# Patient Record
Sex: Female | Born: 1955
Health system: Southern US, Community
[De-identification: ages and names within clinical notes are randomized; demographics above are authoritative.]

## PROBLEM LIST (undated history)

## (undated) DIAGNOSIS — M1990 Unspecified osteoarthritis, unspecified site: Secondary | ICD-10-CM

## (undated) DIAGNOSIS — Z9889 Other specified postprocedural states: Secondary | ICD-10-CM

## (undated) DIAGNOSIS — E119 Type 2 diabetes mellitus without complications: Secondary | ICD-10-CM

## (undated) DIAGNOSIS — L732 Hidradenitis suppurativa: Secondary | ICD-10-CM

## (undated) DIAGNOSIS — K219 Gastro-esophageal reflux disease without esophagitis: Secondary | ICD-10-CM

## (undated) DIAGNOSIS — F32A Depression, unspecified: Secondary | ICD-10-CM

## (undated) DIAGNOSIS — J45909 Unspecified asthma, uncomplicated: Secondary | ICD-10-CM

## (undated) DIAGNOSIS — R112 Nausea with vomiting, unspecified: Secondary | ICD-10-CM

## (undated) DIAGNOSIS — K529 Noninfective gastroenteritis and colitis, unspecified: Secondary | ICD-10-CM

## (undated) HISTORY — PX: CHOLECYSTECTOMY: SHX55

## (undated) HISTORY — PX: SPINAL FUSION: SHX223

## (undated) HISTORY — PX: FOOT SURGERY: SHX648

## (undated) HISTORY — PX: TUBAL LIGATION: SHX77

## (undated) HISTORY — PX: TONSILLECTOMY: SUR1361

## (undated) HISTORY — PX: HYDRADENITIS EXCISION: SHX5243

## (undated) HISTORY — PX: APPENDECTOMY: SHX54

## (undated) HISTORY — PX: BACK SURGERY: SHX140

## (undated) HISTORY — PX: ABDOMINAL HYSTERECTOMY: SHX81

## (undated) HISTORY — PX: HAND SURGERY: SHX662

---

## 2005-01-25 ENCOUNTER — Emergency Department: Payer: Self-pay | Admitting: Emergency Medicine

## 2005-11-23 ENCOUNTER — Ambulatory Visit: Payer: Self-pay

## 2006-05-23 ENCOUNTER — Ambulatory Visit: Payer: Self-pay | Admitting: Family Medicine

## 2006-12-06 ENCOUNTER — Emergency Department: Payer: Self-pay | Admitting: Emergency Medicine

## 2006-12-13 ENCOUNTER — Ambulatory Visit: Payer: Self-pay

## 2008-03-16 ENCOUNTER — Ambulatory Visit: Payer: Self-pay | Admitting: Family Medicine

## 2011-09-19 DIAGNOSIS — L732 Hidradenitis suppurativa: Secondary | ICD-10-CM | POA: Insufficient documentation

## 2014-01-07 DIAGNOSIS — M65341 Trigger finger, right ring finger: Secondary | ICD-10-CM | POA: Insufficient documentation

## 2014-04-27 ENCOUNTER — Ambulatory Visit: Payer: Self-pay | Admitting: Internal Medicine

## 2014-06-03 ENCOUNTER — Ambulatory Visit: Payer: Self-pay | Admitting: Urology

## 2014-06-18 ENCOUNTER — Ambulatory Visit: Payer: Self-pay | Admitting: Unknown Physician Specialty

## 2014-07-06 ENCOUNTER — Ambulatory Visit: Payer: Self-pay | Admitting: Podiatry

## 2014-11-30 DIAGNOSIS — Z87891 Personal history of nicotine dependence: Secondary | ICD-10-CM | POA: Insufficient documentation

## 2014-11-30 DIAGNOSIS — K8020 Calculus of gallbladder without cholecystitis without obstruction: Secondary | ICD-10-CM | POA: Insufficient documentation

## 2015-08-24 DIAGNOSIS — M4712 Other spondylosis with myelopathy, cervical region: Secondary | ICD-10-CM | POA: Insufficient documentation

## 2019-07-28 ENCOUNTER — Emergency Department: Payer: Medicaid Other

## 2019-07-28 ENCOUNTER — Encounter: Payer: Self-pay | Admitting: Emergency Medicine

## 2019-07-28 ENCOUNTER — Emergency Department
Admission: EM | Admit: 2019-07-28 | Discharge: 2019-07-28 | Disposition: A | Discharge status: Home / Self Care | Payer: Medicaid Other | Attending: Emergency Medicine | Admitting: Emergency Medicine

## 2019-07-28 ENCOUNTER — Other Ambulatory Visit: Payer: Self-pay

## 2019-07-28 DIAGNOSIS — Z91040 Latex allergy status: Secondary | ICD-10-CM | POA: Diagnosis not present

## 2019-07-28 DIAGNOSIS — Z87891 Personal history of nicotine dependence: Secondary | ICD-10-CM | POA: Insufficient documentation

## 2019-07-28 DIAGNOSIS — R42 Dizziness and giddiness: Secondary | ICD-10-CM | POA: Diagnosis not present

## 2019-07-28 DIAGNOSIS — R112 Nausea with vomiting, unspecified: Secondary | ICD-10-CM | POA: Insufficient documentation

## 2019-07-28 DIAGNOSIS — R739 Hyperglycemia, unspecified: Secondary | ICD-10-CM

## 2019-07-28 DIAGNOSIS — E1165 Type 2 diabetes mellitus with hyperglycemia: Secondary | ICD-10-CM | POA: Insufficient documentation

## 2019-07-28 HISTORY — DX: Type 2 diabetes mellitus without complications: E11.9

## 2019-07-28 LAB — BASIC METABOLIC PANEL
Anion gap: 14 (ref 5–15)
BUN: 15 mg/dL (ref 8–23)
CO2: 24 mmol/L (ref 22–32)
Calcium: 10 mg/dL (ref 8.9–10.3)
Chloride: 95 mmol/L — ABNORMAL LOW (ref 98–111)
Creatinine, Ser: 0.56 mg/dL (ref 0.44–1.00)
GFR calc Af Amer: >60 mL/min (ref >60)
GFR calc non Af Amer: >60 mL/min (ref >60)
Glucose, Bld: 419 mg/dL — ABNORMAL HIGH (ref 70–99)
Potassium: 4.4 mmol/L (ref 3.5–5.1)
Sodium: 133 mmol/L — ABNORMAL LOW (ref 135–145)

## 2019-07-28 LAB — URINALYSIS, COMPLETE (UACMP) WITH MICROSCOPIC
Bacteria, UA: NONE SEEN
Bilirubin Urine: NEGATIVE
Glucose, UA: >=500 mg/dL — AB
Hgb urine dipstick: NEGATIVE
Ketones, ur: 5 mg/dL — AB
Leukocytes,Ua: NEGATIVE
Nitrite: NEGATIVE
Protein, ur: NEGATIVE mg/dL
Specific Gravity, Urine: 1.039 — ABNORMAL HIGH (ref 1.005–1.030)
pH: 5 (ref 5.0–8.0)

## 2019-07-28 LAB — GLUCOSE, CAPILLARY
Glucose-Capillary: 380 mg/dL — ABNORMAL HIGH (ref 70–99)
Glucose-Capillary: 266 mg/dL — ABNORMAL HIGH (ref 70–99)
Glucose-Capillary: 211 mg/dL — ABNORMAL HIGH (ref 70–99)

## 2019-07-28 LAB — CBC
HCT: 45.8 % (ref 36.0–46.0)
Hemoglobin: 15.9 g/dL — ABNORMAL HIGH (ref 12.0–15.0)
MCH: 28.7 pg (ref 26.0–34.0)
MCHC: 34.7 g/dL (ref 30.0–36.0)
MCV: 82.7 fL (ref 80.0–100.0)
Platelets: 294 10*3/uL (ref 150–400)
RBC: 5.54 MIL/uL — ABNORMAL HIGH (ref 3.87–5.11)
RDW: 11.8 % (ref 11.5–15.5)
WBC: 7.2 10*3/uL (ref 4.0–10.5)
nRBC: 0 % (ref 0.0–0.2)

## 2019-07-28 LAB — TROPONIN I (HIGH SENSITIVITY): Troponin I (High Sensitivity): 4 ng/L (ref <18)

## 2019-07-28 MED ORDER — GLIPIZIDE 5 MG PO TABS: 5.0000 mg | ORAL_TABLET | Freq: Every day | ORAL | 0 refills | Status: DC

## 2019-07-28 MED ORDER — MECLIZINE HCL 25 MG PO TABS
25.0000 mg | ORAL_TABLET | Freq: Once | ORAL | Status: AC
  Administered 2019-07-28: 25 mg via ORAL
  Filled 2019-07-28: qty 1

## 2019-07-28 MED ORDER — SODIUM CHLORIDE 0.9 % IV BOLUS
1000.0000 mL | Freq: Once | INTRAVENOUS | Status: AC
  Administered 2019-07-28: 1000 mL via INTRAVENOUS

## 2019-07-28 MED ORDER — ONDANSETRON HCL 4 MG/2ML IJ SOLN
4.0000 mg | Freq: Once | INTRAMUSCULAR | Status: AC
  Administered 2019-07-28: 4 mg via INTRAVENOUS
  Filled 2019-07-28: qty 2

## 2019-07-28 MED ORDER — MECLIZINE HCL 25 MG PO TABS: 25.0000 mg | ORAL_TABLET | Freq: Three times a day (TID) | ORAL | 0 refills | Status: AC | PRN

## 2019-07-28 NOTE — ED Provider Notes (Addendum)
[facility] Emergency Department Provider Note  ____________________________________________   First MD Initiated Contact with Patient 07/28/19 1652     (approximate)  I have reviewed the triage vital signs and the nursing notes.   HISTORY  Chief Complaint Dizziness    HPI Debra Arnold is a 63 y.o. female with diabetes who presents with dizziness.  Patient has been having dizziness for the past days.  Is been associated intermittently with vomiting.  The dizziness is severe, constant, better with her eyes closed, worse with her eyes open and moving around.  She intermittently has a headache but none currently.  She says that she has been ataxic with walking with this.  Recently tested for Lyme disease but never got her results back.   Past Medical History:  Diagnosis Date  . Diabetes mellitus without complication ([place])     There are no active problems to display for this patient.   Past Surgical History:  Procedure Laterality Date  . ABDOMINAL HYSTERECTOMY    . APPENDECTOMY    . CHOLECYSTECTOMY    . FOOT SURGERY Left   . HAND SURGERY Right   . SPINAL FUSION    . TONSILLECTOMY    . TUBAL LIGATION      Prior to Admission medications   Not on File    Allergies Demerol [meperidine hcl] and Latex  History reviewed. No pertinent family history.  Social History Social History   Tobacco Use  . Smoking status: Former Research scientist (life sciences)  . Smokeless tobacco: Never Used  Substance Use Topics  . Alcohol use: Never    Frequency: Never  . Drug use: Never      Review of Systems Constitutional: No fever/chills Eyes: No visual changes. ENT: No sore throat. Cardiovascular: Denies chest pain. Respiratory: Denies shortness of breath. Gastrointestinal: No abdominal pain.  Positive vomiting no diarrhea.  No constipation. Genitourinary: Negative for dysuria. Musculoskeletal: Negative for back pain. Skin: Negative for rash. Neurological: Dizziness,  difficulty ambulating All other ROS negative ____________________________________________   PHYSICAL EXAM:  VITAL SIGNS: ED Triage Vitals  Enc Vitals Group     BP 07/28/19 1543 130/78     Pulse Rate 07/28/19 1543 (!) 106     Resp 07/28/19 1543 14     Temp 07/28/19 1543 98.6 F (37 C)     Temp Source 07/28/19 1543 Oral     SpO2 07/28/19 1543 98 %     Weight 07/28/19 1546 152 lb (68.9 kg)     Height 07/28/19 1546 5\' 6"  (1.676 m)     Head Circumference --      Peak Flow --      Pain Score 07/28/19 1546 0     Pain Loc --      Pain Edu? --      Excl. in [place]? --     Constitutional: Alert and oriented. Well appearing and in no acute distress. Eyes: Conjunctivae are normal. EOMI. Head: Atraumatic. Nose: No congestion/rhinnorhea. Mouth/Throat: Mucous membranes are moist.   Neck: No stridor. Trachea Midline. FROM Cardiovascular: Normal rate, regular rhythm. Grossly normal heart sounds.  Good peripheral circulation. Respiratory: Normal respiratory effort.  No retractions. Lungs CTAB. Gastrointestinal: Soft and nontender. No distention. No abdominal bruits.  Musculoskeletal: No lower extremity tenderness nor edema.  No joint effusions. Neurologic: Cranial nerves II through XII are intact.  Equal strength in arms and legs.  Finger-to-nose not as great on the left hand.  Slightly ataxic with walking.  Skin:  Skin  is warm, dry and intact. No rash noted. Psychiatric: Mood and affect are normal. Speech and behavior are normal. GU: Deferred   ____________________________________________   LABS (all labs ordered are listed, but only abnormal results are displayed)  Labs Reviewed  BASIC METABOLIC PANEL - Abnormal; Notable for the following components:      Result Value   Sodium 133 (*)    Chloride 95 (*)    Glucose, Bld 419 (*)    All other components within normal limits  CBC - Abnormal; Notable for the following components:   RBC 5.54 (*)    Hemoglobin 15.9 (*)    All other  components within normal limits  URINALYSIS, COMPLETE (UACMP) WITH MICROSCOPIC - Abnormal; Notable for the following components:   Color, Urine STRAW (*)    APPearance CLEAR (*)    Specific Gravity, Urine 1.039 (*)    Glucose, UA >=500 (*)    Ketones, ur 5 (*)    All other components within normal limits  GLUCOSE, CAPILLARY - Abnormal; Notable for the following components:   Glucose-Capillary 380 (*)    All other components within normal limits  CBG MONITORING, ED   ____________________________________________   ED ECG REPORT I, Concha SeMary E Anahis Furgeson, the attending physician, personally viewed and interpreted this ECG.  EKG is sinus tachycardia rate of 107, no ST elevation, no T wave inversion, normal intervals, left axis deviation __  INITIAL IMPRESSION / ASSESSMENT AND PLAN / ED COURSE  Melanie CrazierKaren L Boyte was evaluated in Emergency Department on 07/28/2019 for the symptoms described in the history of present illness. She was evaluated in the context of the global COVID-19 pandemic, which necessitated consideration that the patient might be at risk for infection with the SARS-CoV-2 virus that causes COVID-19. Institutional protocols and algorithms that pertain to the evaluation of patients at risk for COVID-19 are in a state of rapid change based on information released by regulatory bodies including the CDC and federal and state organizations. These policies and algorithms were followed during the patient's care in the ED.    Patient is a 63 year old who presents with 5 days of dizziness, ataxia and vomiting.  Concern that this could be a posterior stroke therefore will get MRI to rule this out.  Also will get labs to evaluate for electrolyte abnormalities, DKA, hyperglycemia, anemia.  Urine with 6-10 WBCs and greater than 500 glucose no symptoms so we will send for culture. Denies any symptoms of UTI.   Sugars elevated 380 but no anion gap elevation to suggest DKA.  No evidence of anemia.   Patient will be given 2 L of fluids given the high sugars and tachycardia.  Dehydration uncontrolled sugars could be contributing to her symptoms.  However will still get MRI to make sure there is no signs of stroke.  Patient is feeling better after the fluids.  Patient says that she ran out of glipizide therefore we will give a 1 month prescription for this.  Patient is encouraged to follow-up with her primary care doctor for repeat sugar testing in 1 week.  MRI negative.  Pt d/c home.    ____________________________________________   FINAL CLINICAL IMPRESSION(S) / ED DIAGNOSES   Final diagnoses:  Dizziness  Hyperglycemia      MEDICATIONS GIVEN DURING THIS VISIT:  Medications  sodium chloride 0.9 % bolus 1,000 mL (has no administration in time range)  sodium chloride 0.9 % bolus 1,000 mL (1,000 mLs Intravenous New Bag/Given 07/28/19 1743)  meclizine (ANTIVERT) tablet 25  mg (25 mg Oral Given 07/28/19 1744)  ondansetron (ZOFRAN) injection 4 mg (4 mg Intravenous Given 07/28/19 1744)     ED Discharge Orders         Ordered    glipiZIDE (GLUCOTROL) 5 MG tablet  Daily     07/28/19 2011    meclizine (ANTIVERT) 25 MG tablet  3 times daily PRN     07/28/19 2011           Note:  This document was prepared using Dragon voice recognition software and may include unintentional dictation errors.   Concha Se, MD 07/28/19 2026    Concha Se, MD 07/28/19 2124

## 2019-07-28 NOTE — ED Notes (Signed)
Report off to daniel rn  

## 2019-07-28 NOTE — Discharge Instructions (Addendum)
Your work-up was reassuring except for elevated sugar.  We restarted your glipizide to take with your Metformin.  You can take the meclizine to help with the dizziness.  You should follow-up with your doctor within 1 week for blood glucose recheck.  Return to the ER for any other concerns.

## 2019-07-28 NOTE — ED Notes (Signed)
Patient to MRI.

## 2019-07-28 NOTE — ED Triage Notes (Signed)
Pt to ED from home c/o dizziness since Thursday, nausea with vomiting first couple days, none today.  States when she opens her eyes it's hard to focus.  States intermittent headache, no pain at this time.  Tested for lyme disease recently for multiple tick bites.

## 2019-07-30 LAB — URINE CULTURE: Culture: <10000 — AB

## 2019-12-10 ENCOUNTER — Encounter: Payer: Self-pay | Admitting: Emergency Medicine

## 2019-12-10 ENCOUNTER — Emergency Department
Admission: EM | Admit: 2019-12-10 | Discharge: 2019-12-10 | Disposition: A | Discharge status: Home / Self Care | Payer: Medicaid Other | Attending: Emergency Medicine | Admitting: Emergency Medicine

## 2019-12-10 ENCOUNTER — Other Ambulatory Visit: Payer: Self-pay

## 2019-12-10 ENCOUNTER — Emergency Department: Payer: Medicaid Other

## 2019-12-10 DIAGNOSIS — G8929 Other chronic pain: Secondary | ICD-10-CM | POA: Diagnosis not present

## 2019-12-10 DIAGNOSIS — Z87891 Personal history of nicotine dependence: Secondary | ICD-10-CM | POA: Diagnosis not present

## 2019-12-10 DIAGNOSIS — Z91040 Latex allergy status: Secondary | ICD-10-CM | POA: Insufficient documentation

## 2019-12-10 DIAGNOSIS — R1011 Right upper quadrant pain: Secondary | ICD-10-CM | POA: Diagnosis not present

## 2019-12-10 DIAGNOSIS — E119 Type 2 diabetes mellitus without complications: Secondary | ICD-10-CM | POA: Diagnosis not present

## 2019-12-10 DIAGNOSIS — Z9049 Acquired absence of other specified parts of digestive tract: Secondary | ICD-10-CM | POA: Diagnosis not present

## 2019-12-10 LAB — URINALYSIS, COMPLETE (UACMP) WITH MICROSCOPIC
Bacteria, UA: NONE SEEN
Bilirubin Urine: NEGATIVE
Glucose, UA: >=500 mg/dL — AB
Hgb urine dipstick: NEGATIVE
Ketones, ur: NEGATIVE mg/dL
Leukocytes,Ua: NEGATIVE
Nitrite: NEGATIVE
Protein, ur: NEGATIVE mg/dL
Specific Gravity, Urine: 1.032 — ABNORMAL HIGH (ref 1.005–1.030)
pH: 5 (ref 5.0–8.0)

## 2019-12-10 LAB — COMPREHENSIVE METABOLIC PANEL
ALT: 19 U/L (ref 0–44)
AST: 23 U/L (ref 15–41)
Albumin: 4.3 g/dL (ref 3.5–5.0)
Alkaline Phosphatase: 81 U/L (ref 38–126)
Anion gap: 15 (ref 5–15)
BUN: 16 mg/dL (ref 8–23)
CO2: 22 mmol/L (ref 22–32)
Calcium: 9.4 mg/dL (ref 8.9–10.3)
Chloride: 94 mmol/L — ABNORMAL LOW (ref 98–111)
Creatinine, Ser: 0.64 mg/dL (ref 0.44–1.00)
GFR calc Af Amer: >60 mL/min (ref >60)
GFR calc non Af Amer: >60 mL/min (ref >60)
Glucose, Bld: 578 mg/dL (ref 70–99)
Potassium: 3.8 mmol/L (ref 3.5–5.1)
Sodium: 131 mmol/L — ABNORMAL LOW (ref 135–145)
Total Bilirubin: 0.7 mg/dL (ref 0.3–1.2)
Total Protein: 7.3 g/dL (ref 6.5–8.1)

## 2019-12-10 LAB — CBC
HCT: 40.2 % (ref 36.0–46.0)
Hemoglobin: 14 g/dL (ref 12.0–15.0)
MCH: 29.5 pg (ref 26.0–34.0)
MCHC: 34.8 g/dL (ref 30.0–36.0)
MCV: 84.6 fL (ref 80.0–100.0)
Platelets: 233 10*3/uL (ref 150–400)
RBC: 4.75 MIL/uL (ref 3.87–5.11)
RDW: 11.5 % (ref 11.5–15.5)
WBC: 5.8 10*3/uL (ref 4.0–10.5)
nRBC: 0.3 % — ABNORMAL HIGH (ref 0.0–0.2)

## 2019-12-10 LAB — LIPASE, BLOOD: Lipase: 37 U/L (ref 11–51)

## 2019-12-10 LAB — GLUCOSE, CAPILLARY: Glucose-Capillary: 171 mg/dL — ABNORMAL HIGH (ref 70–99)

## 2019-12-10 MED ORDER — INSULIN ASPART 100 UNIT/ML ~~LOC~~ SOLN
10.0000 [IU] | Freq: Once | SUBCUTANEOUS | Status: AC
  Administered 2019-12-10: 10 [IU] via INTRAVENOUS
  Filled 2019-12-10: qty 1

## 2019-12-10 MED ORDER — SODIUM CHLORIDE 0.9 % IV BOLUS
1000.0000 mL | Freq: Once | INTRAVENOUS | Status: AC
  Administered 2019-12-10: 1000 mL via INTRAVENOUS

## 2019-12-10 MED ORDER — KETOROLAC TROMETHAMINE 30 MG/ML IJ SOLN
15.0000 mg | INTRAMUSCULAR | Status: AC
  Administered 2019-12-10: 15 mg via INTRAVENOUS
  Filled 2019-12-10: qty 1

## 2019-12-10 MED ORDER — GLIPIZIDE 5 MG PO TABS: 5.0000 mg | ORAL_TABLET | Freq: Every day | ORAL | 1 refills | Status: AC

## 2019-12-10 MED ORDER — METOCLOPRAMIDE HCL 10 MG PO TABS: 10.0000 mg | ORAL_TABLET | Freq: Four times a day (QID) | ORAL | 0 refills | Status: AC | PRN

## 2019-12-10 MED ORDER — ALUMINUM-MAGNESIUM-SIMETHICONE 200-200-20 MG/5ML PO SUSP: 30.0000 mL | Freq: Three times a day (TID) | ORAL | 0 refills | Status: AC

## 2019-12-10 MED ORDER — GLIPIZIDE 5 MG PO TABS: 5.0000 mg | ORAL_TABLET | Freq: Every day | ORAL | 1 refills | Status: DC

## 2019-12-10 NOTE — ED Provider Notes (Signed)
[facility] Emergency Department Provider Note  ____________________________________________  Time seen: Approximately 7:31 PM  I have reviewed the triage vital signs and the nursing notes.   HISTORY  Chief Complaint Abdominal Pain    HPI Debra Arnold is a 64 y.o. female with a history of diabetes who comes ED complaining of right upper quadrant abdominal pain for the past 6 weeks.  Reports that she went to an emergency department while traveling in [place] state 2 weeks ago, had a contrast-enhanced CT scan which they said showed diverticulosis without diverticulitis, no other acute findings.  She has a history of cholecystectomy about 5 years ago.  Oral intake has been normal.  No nausea vomiting or diarrhea.  Pain is nonradiating without aggravating or alleviating factors.  She is taking Carafate and Protonix without relief.      Past Medical History:  Diagnosis Date  . Diabetes mellitus without complication ([place])      There are no problems to display for this patient.    Past Surgical History:  Procedure Laterality Date  . ABDOMINAL HYSTERECTOMY    . APPENDECTOMY    . CHOLECYSTECTOMY    . FOOT SURGERY Left   . HAND SURGERY Right   . SPINAL FUSION    . TONSILLECTOMY    . TUBAL LIGATION       Prior to Admission medications   Medication Sig Start Date End Date Taking? Authorizing Provider  glipiZIDE (GLUCOTROL) 5 MG tablet Take 1 tablet (5 mg total) by mouth daily. 07/28/19 08/27/19  Vanessa Bronson, MD  meclizine (ANTIVERT) 25 MG tablet Take 1 tablet (25 mg total) by mouth 3 (three) times daily as needed for dizziness. 07/28/19   Vanessa [place], MD     Allergies Demerol [meperidine hcl], Latex, and Metformin and related   No family history on file.  Social History Social History   Tobacco Use  . Smoking status: Former Research scientist (life sciences)  . Smokeless tobacco: Never Used  Substance Use Topics  . Alcohol use: Never  . Drug use: Never     Review of Systems  Constitutional:   No fever or chills.  ENT:   No sore throat. No rhinorrhea. Cardiovascular:   No chest pain or syncope. Respiratory:   No dyspnea or cough. Gastrointestinal:   Positive as above for abdominal pain without vomiting and diarrhea.  Musculoskeletal:   Negative for focal pain or swelling All other systems reviewed and are negative except as documented above in ROS and HPI.  ____________________________________________   PHYSICAL EXAM:  VITAL SIGNS: ED Triage Vitals  Enc Vitals Group     BP 12/10/19 1607 (!) 147/79     Pulse Rate 12/10/19 1607 (!) 103     Resp 12/10/19 1607 16     Temp 12/10/19 1607 98.2 F (36.8 C)     Temp Source 12/10/19 1607 Oral     SpO2 12/10/19 1607 98 %     Weight 12/10/19 1608 168 lb (76.2 kg)     Height 12/10/19 1608 5\' 6"  (1.676 m)     Head Circumference --      Peak Flow --      Pain Score 12/10/19 1607 8     Pain Loc --      Pain Edu? --      Excl. in [place]? --     Vital signs reviewed, nursing assessments reviewed.   Constitutional:   Alert and oriented. Non-toxic appearance. Eyes:   Conjunctivae are  normal. EOMI. PERRL. ENT      Head:   Normocephalic and atraumatic.      Nose:   Wearing a mask.      Mouth/Throat:   Wearing a mask.      Neck:   No meningismus. Full ROM. Hematological/Lymphatic/Immunilogical:   No cervical lymphadenopathy. Cardiovascular:   RRR. Symmetric bilateral radial and DP pulses.  No murmurs. Cap refill less than 2 seconds. Respiratory:   Normal respiratory effort without tachypnea/retractions. Breath sounds are clear and equal bilaterally. No wheezes/rales/rhonchi. Gastrointestinal:   Soft with right upper quadrant tenderness. Non distended. There is no CVA tenderness.  No rebound, rigidity, or guarding. Musculoskeletal:   Normal range of motion in all extremities. No joint effusions.  No lower extremity tenderness.  No edema. Neurologic:   Normal speech and language.  Motor  grossly intact. No acute focal neurologic deficits are appreciated.  Skin:    Skin is warm, dry and intact. No rash noted.  No petechiae, purpura, or bullae.  ____________________________________________    LABS (pertinent positives/negatives) (all labs ordered are listed, but only abnormal results are displayed) Labs Reviewed  COMPREHENSIVE METABOLIC PANEL - Abnormal; Notable for the following components:      Result Value   Sodium 131 (*)    Chloride 94 (*)    Glucose, Bld 578 (*)    All other components within normal limits  CBC - Abnormal; Notable for the following components:   nRBC 0.3 (*)    All other components within normal limits  URINALYSIS, COMPLETE (UACMP) WITH MICROSCOPIC - Abnormal; Notable for the following components:   Color, Urine STRAW (*)    APPearance CLEAR (*)    Specific Gravity, Urine 1.032 (*)    Glucose, UA >=500 (*)    All other components within normal limits  LIPASE, BLOOD   ____________________________________________   EKG    ____________________________________________    RADIOLOGY  US Abdomen Complete  Result Date: 12/10/2019 CLINICAL DATA:  Right upper quadrant pain EXAM: ABDOMEN ULTRASOUND COMPLETE COMPARISON:  CT 04/25/2014 FINDINGS: Gallbladder: Surgically absent Common bile duct: Diameter: 4.7 mm Liver: Heterogeneous echotexture with scattered areas of increased echogenicity. No focal hepatic abnormality. Portal vein is patent on color Doppler imaging with normal direction of blood flow towards the liver. IVC: No abnormality visualized. Pancreas: Visualized portion unremarkable. Spleen: Size and appearance within normal limits. Right Kidney: Length: 12.7 cm. Echogenicity within normal limits. No mass or hydronephrosis visualized. Left Kidney: Length: 14 cm. Echogenicity within normal limits. No mass or hydronephrosis visualized. Abdominal aorta: No aneurysm visualized.  2.5 cm Other findings: None. IMPRESSION: 1. Status post  cholecystectomy without biliary dilatation 2. Heterogeneous slightly echogenic liver suggesting steatosis. 3. Slightly generous sized kidneys bilaterally which are otherwise morphologically normal and demonstrate no hydronephrosis. Electronically Signed   By: Jasmine Pang M.D.   On: 12/10/2019 19:14    ____________________________________________   PROCEDURES Procedures  ____________________________________________    CLINICAL IMPRESSION / ASSESSMENT AND PLAN / ED COURSE  Medications ordered in the ED: Medications  sodium chloride 0.9 % bolus 1,000 mL (0 mLs Intravenous Stopped 12/10/19 1834)  sodium chloride 0.9 % bolus 1,000 mL (1,000 mLs Intravenous New Bag/Given 12/10/19 1832)  insulin aspart (novoLOG) injection 10 Units (10 Units Intravenous Given 12/10/19 1831)  ketorolac (TORADOL) 30 MG/ML injection 15 mg (15 mg Intravenous Given 12/10/19 1831)    Pertinent labs & imaging results that were available during my care of the patient were reviewed by me and considered in my  medical decision making (see chart for details).  Debra Arnold was evaluated in Emergency Department on 12/10/2019 for the symptoms described in the history of present illness. She was evaluated in the context of the global COVID-19 pandemic, which necessitated consideration that the patient might be at risk for infection with the SARS-CoV-2 virus that causes COVID-19. Institutional protocols and algorithms that pertain to the evaluation of patients at risk for COVID-19 are in a state of rapid change based on information released by regulatory bodies including the CDC and federal and state organizations. These policies and algorithms were followed during the patient's care in the ED.   Patient presents with chronic abdominal pain ongoing for the past 6 weeks.  Vital signs unremarkable except for mild tachycardia which I think is due to dehydration from osmotic diuresis from her hyperglycemia.  She reports she has been  out of glipizide but still taking Metformin.  Does not yet have a primary care doctor due to recently moving to the area.  She is nontoxic.  Possible biliary complication despite cholecystectomy 5 years ago versus GERD/gastritis.  Will obtain an ultrasound of the abdomen, give IV fluids and IV insulin aspart for glycemic control, plan for GI follow-up.  Clinical Course as of Dec 09 1937  Wed Dec 10, 2019  1936 US unremarkable. Will DC home to f/u with PCP and GI.    [PS]    Clinical Course User Index [PS] Sharman Cheek, MD     ____________________________________________   FINAL CLINICAL IMPRESSION(S) / ED DIAGNOSES    Final diagnoses:  Chronic abdominal pain  Type 2 diabetes mellitus without complication, without long-term current use of insulin [facility])     ED Discharge Orders    None      Portions of this note were generated with dragon dictation software. Dictation errors may occur despite best attempts at proofreading.   Sharman Cheek, MD 12/10/19 1939

## 2019-12-10 NOTE — ED Notes (Signed)
meds given as ordered, 2decho at bedside.

## 2019-12-10 NOTE — ED Triage Notes (Signed)
Pt in via POV, reports right sided abdominal pain x approximately 6 weeks, seen by UC previously, prescribed Carafate and Protonix without any relief.  Denies N/V/D.  NAD noted at this time.

## 2019-12-11 ENCOUNTER — Encounter (INDEPENDENT_AMBULATORY_CARE_PROVIDER_SITE_OTHER): Payer: Self-pay

## 2019-12-18 ENCOUNTER — Ambulatory Visit
Admission: RE | Admit: 2019-12-18 | Discharge: 2019-12-18 | Disposition: A | Discharge status: Home / Self Care | Payer: Medicaid Other | Source: Ambulatory Visit | Attending: Gastroenterology | Admitting: Gastroenterology

## 2019-12-18 ENCOUNTER — Encounter: Payer: Self-pay | Admitting: Gastroenterology

## 2019-12-18 ENCOUNTER — Ambulatory Visit: Payer: Medicaid Other | Admitting: Gastroenterology

## 2019-12-18 ENCOUNTER — Other Ambulatory Visit: Payer: Self-pay

## 2019-12-18 VITALS — BP 137/84 | HR 92 | Temp 98.2°F | Ht 66.0 in | Wt 167.6 lb

## 2019-12-18 DIAGNOSIS — M7918 Myalgia, other site: Secondary | ICD-10-CM | POA: Diagnosis not present

## 2019-12-18 NOTE — Progress Notes (Signed)
Wyline Mood MD, MRCP(U.K) [address]  [address]  [place], [place] [address]  Main: [phone]  Fax: [phone]   Gastroenterology Consultation  Referring Provider:     ER Primary Care Physician:  Patient, No Pcp Per Primary Gastroenterologist:  Dr. Wyline Mood  Reason for Consultation:     Abdominal pain         HPI:   Debra Arnold is a 64 y.o. y/o female presented to the ER on 12/10/2019 for RUQ pain for 6 weeks duration. S/p Cholecystectomy 5 years back.  2 weeks prior patient stated that a CT scan performed of her abdomen in [place] showed diverticulosis with no diverticulitis. She underwent a RUQ USG on 12/10/2019 and showed no biliary dilation and hepatic steatosis. Labs showed a normal HB of 14 grams, Glucose of 578 , lipase is normal.   She states that since December she has been having pain in the right upper quadrant over a particular point sharp in nature worse with movement.  She also has some itching over her skin.  Denies any falls.  No other aggravating factors in terms of food intake or relieving factors in terms of having a bowel movement.  Pain has been constant.  She brought in her CT scan report from [place] which showed no abnormalities in her abdomen which we will have scanned into her chart.  Past Medical History:  Diagnosis Date  . Diabetes mellitus without complication [facility])     Past Surgical History:  Procedure Laterality Date  . ABDOMINAL HYSTERECTOMY    . APPENDECTOMY    . CHOLECYSTECTOMY    . FOOT SURGERY Left   . HAND SURGERY Right   . SPINAL FUSION    . TONSILLECTOMY    . TUBAL LIGATION      Prior to Admission medications   Medication Sig Start Date End Date Taking? Authorizing Provider  aluminum-magnesium hydroxide-simethicone (MAALOX) 200-200-20 MG/5ML SUSP Take 30 mLs by mouth 4 (four) times daily -  before meals and at bedtime. 12/10/19   Sharman Cheek, MD  glipiZIDE (GLUCOTROL) 5 MG tablet Take 1 tablet (5 mg total) by mouth  daily. 12/10/19 01/09/20  Sharman Cheek, MD  meclizine (ANTIVERT) 25 MG tablet Take 1 tablet (25 mg total) by mouth 3 (three) times daily as needed for dizziness. 07/28/19   Concha Se, MD  metoCLOPramide (REGLAN) 10 MG tablet Take 1 tablet (10 mg total) by mouth every 6 (six) hours as needed. 12/10/19   Sharman Cheek, MD    No family history on file.   Social History   Tobacco Use  . Smoking status: Former Games developer  . Smokeless tobacco: Never Used  Substance Use Topics  . Alcohol use: Never  . Drug use: Never    Allergies as of 12/18/2019 - Review Complete 12/10/2019  Allergen Reaction Noted  . Demerol [meperidine hcl] Nausea And Vomiting 07/28/2019  . Latex Hives and Rash 07/28/2019  . Metformin and related Diarrhea 12/10/2019    Review of Systems:    All systems reviewed and negative except where noted in HPI.   Physical Exam:  There were no vitals taken for this visit. No LMP recorded. Patient has had a hysterectomy. Psych:  Alert and cooperative. Normal mood and affect. General:   Alert,  Well-developed, well-nourished, pleasant and cooperative in NAD Head:  Normocephalic and atraumatic. Eyes:  Sclera clear, no icterus.   Conjunctiva pink. Ears:  Normal auditory acuity. Lungs:  Respirations even and unlabored.  Clear throughout to auscultation.   No wheezes, crackles, or rhonchi. No acute distress. Heart:  Regular rate and rhythm; no murmurs, clicks, rubs, or gallops. Abdomen: Tenderness over the right lowermost rib in the anterior axillary line.  Point tenderness reproducible.  Normal bowel sounds.  No bruits.  Soft, non-tender and non-distended without masses, hepatosplenomegaly or hernias noted.  No guarding or rebound tenderness.    Neurologic:  Alert and oriented x3;  grossly normal neurologically. Skin:  Intact without significant lesions or rashes. No jaundice. Lymph Nodes:  No significant cervical adenopathy. Psych:  Alert and cooperative. Normal mood and  affect.  Imaging Studies: US Abdomen Complete  Result Date: 12/10/2019 CLINICAL DATA:  Right upper quadrant pain EXAM: ABDOMEN ULTRASOUND COMPLETE COMPARISON:  CT 04/25/2014 FINDINGS: Gallbladder: Surgically absent Common bile duct: Diameter: 4.7 mm Liver: Heterogeneous echotexture with scattered areas of increased echogenicity. No focal hepatic abnormality. Portal vein is patent on color Doppler imaging with normal direction of blood flow towards the liver. IVC: No abnormality visualized. Pancreas: Visualized portion unremarkable. Spleen: Size and appearance within normal limits. Right Kidney: Length: 12.7 cm. Echogenicity within normal limits. No mass or hydronephrosis visualized. Left Kidney: Length: 14 cm. Echogenicity within normal limits. No mass or hydronephrosis visualized. Abdominal aorta: No aneurysm visualized.  2.5 cm Other findings: None. IMPRESSION: 1. Status post cholecystectomy without biliary dilatation 2. Heterogeneous slightly echogenic liver suggesting steatosis. 3. Slightly generous sized kidneys bilaterally which are otherwise morphologically normal and demonstrate no hydronephrosis. Electronically Signed   By: Donavan Foil M.D.   On: 12/10/2019 19:14    Assessment and Plan:   Debra Arnold is a 64 y.o. y/o female has been referred for right upper quadrant pain.  Her history and physical exam do not support a GI issue.  She is tender over the right lowermost rib in the anterior axillary line over a particular point which is reproducible suggestive of a musculoskeletal pain.  She may have a small hairline rib fracture.  I suggested that we refer her to interventional pain to consider an intercostal block.  She is due to be established with a PCP.  Follow-up with our office as needed   Dr Jonathon Bellows MD,MRCP(U.K)

## 2019-12-19 ENCOUNTER — Other Ambulatory Visit: Payer: Self-pay

## 2019-12-22 ENCOUNTER — Telehealth: Payer: Self-pay

## 2019-12-22 NOTE — Telephone Encounter (Signed)
Spoke with pt and informed her of x-ray result. Pt is requesting Dr. Johnney Killian advice regarding her symptoms. Pt states she not only has pain over her lower rib but also the surrounding areas. Pt states she now has swelling, tenderness, and aching pain in the upper right to mid-upper abdomen. Pt states she'd like to undergo more GI testing before being referred to interventional pain management.

## 2019-12-22 NOTE — Telephone Encounter (Signed)
-----   Message from Wyline Mood, MD sent at 12/19/2019  9:41 AM EDT ----- No clear evidence of a fracture seen on this x ray - at times if old may not be seen

## 2019-12-23 NOTE — Telephone Encounter (Signed)
Good afternoon  The only other testing that we could do the colonoscopy to look into the colon in the area that you are concerned about.  You already had a scan and an ultrasound.  If you are agreeable to perform a colonoscopy we can go ahead and schedule you for 1.  I do not see that you have had one recently on your chart.  Dr. Tobi Bastos

## 2019-12-23 NOTE — Telephone Encounter (Signed)
MyChart message sent to pt

## 2019-12-25 ENCOUNTER — Other Ambulatory Visit: Payer: Self-pay

## 2019-12-25 DIAGNOSIS — R1011 Right upper quadrant pain: Secondary | ICD-10-CM

## 2019-12-25 MED ORDER — NA SULFATE-K SULFATE-MG SULF 17.5-3.13-1.6 GM/177ML PO SOLN: 1.0000 | Freq: Once | ORAL | 0 refills | Status: AC

## 2019-12-25 NOTE — Telephone Encounter (Signed)
Spoke with pt and was able to schedule procedure on 01-07-20.

## 2020-01-01 ENCOUNTER — Telehealth: Payer: Self-pay

## 2020-01-01 DIAGNOSIS — N950 Postmenopausal bleeding: Secondary | ICD-10-CM | POA: Insufficient documentation

## 2020-01-01 NOTE — Telephone Encounter (Signed)
New patient appointment has been scheduled for 02/11/20. KW    Copied from CRM [id number]. Topic: Appointment Scheduling - Scheduling Inquiry for Clinic >> Jan 01, 2020  1:37 PM [name], [place] wrote: Reason for CRM: Patient called in stating she used to be a patient with the practice, but went to [facility]. Patient states that is a far drive from her and is wondering if she can continue care with office. Please advise.

## 2020-01-05 ENCOUNTER — Other Ambulatory Visit: Payer: Medicaid Other | Attending: Gastroenterology

## 2020-01-05 ENCOUNTER — Telehealth: Payer: Self-pay | Admitting: Gastroenterology

## 2020-01-05 NOTE — Telephone Encounter (Signed)
Patient called & l/m on v/m stating she was having sneezing,coughing & runny nose. Wondering if she should still have her procedure on 01-07-2020?

## 2020-01-05 NOTE — Telephone Encounter (Signed)
Spoke with pt regarding her vm. Pt has decided to reschedule procedure to a later date. Pt procedure was rescheduled to 01-14-20. Updated procedure prep instructions have been sent to pt's mychart.

## 2020-01-12 ENCOUNTER — Other Ambulatory Visit: Payer: Self-pay

## 2020-01-12 ENCOUNTER — Other Ambulatory Visit
Admission: RE | Admit: 2020-01-12 | Discharge: 2020-01-12 | Disposition: A | Discharge status: Home / Self Care | Payer: Medicaid Other | Source: Ambulatory Visit | Attending: Gastroenterology | Admitting: Gastroenterology

## 2020-01-12 DIAGNOSIS — Z01812 Encounter for preprocedural laboratory examination: Secondary | ICD-10-CM | POA: Diagnosis present

## 2020-01-12 DIAGNOSIS — Z20822 Contact with and (suspected) exposure to covid-19: Secondary | ICD-10-CM | POA: Diagnosis not present

## 2020-01-12 LAB — SARS CORONAVIRUS 2 (TAT 6-24 HRS): SARS Coronavirus 2: NEGATIVE

## 2020-01-13 ENCOUNTER — Encounter: Payer: Self-pay | Admitting: Gastroenterology

## 2020-01-14 ENCOUNTER — Other Ambulatory Visit: Payer: Self-pay

## 2020-01-14 ENCOUNTER — Ambulatory Visit: Payer: Medicaid Other | Admitting: Certified Registered Nurse Anesthetist

## 2020-01-14 ENCOUNTER — Encounter
Admission: RE | Disposition: A | Discharge status: Home / Self Care | Payer: Self-pay | Source: Home / Self Care | Attending: Gastroenterology

## 2020-01-14 ENCOUNTER — Ambulatory Visit
Admission: RE | Admit: 2020-01-14 | Discharge: 2020-01-14 | Disposition: A | Discharge status: Home / Self Care | Payer: Medicaid Other | Attending: Gastroenterology | Admitting: Gastroenterology

## 2020-01-14 ENCOUNTER — Encounter: Payer: Self-pay | Admitting: Gastroenterology

## 2020-01-14 DIAGNOSIS — R1011 Right upper quadrant pain: Secondary | ICD-10-CM | POA: Insufficient documentation

## 2020-01-14 DIAGNOSIS — Z9049 Acquired absence of other specified parts of digestive tract: Secondary | ICD-10-CM | POA: Diagnosis not present

## 2020-01-14 DIAGNOSIS — Z885 Allergy status to narcotic agent status: Secondary | ICD-10-CM | POA: Diagnosis not present

## 2020-01-14 DIAGNOSIS — Z90710 Acquired absence of both cervix and uterus: Secondary | ICD-10-CM | POA: Insufficient documentation

## 2020-01-14 DIAGNOSIS — F329 Major depressive disorder, single episode, unspecified: Secondary | ICD-10-CM | POA: Insufficient documentation

## 2020-01-14 DIAGNOSIS — K219 Gastro-esophageal reflux disease without esophagitis: Secondary | ICD-10-CM | POA: Diagnosis not present

## 2020-01-14 DIAGNOSIS — Z7984 Long term (current) use of oral hypoglycemic drugs: Secondary | ICD-10-CM | POA: Diagnosis not present

## 2020-01-14 DIAGNOSIS — E119 Type 2 diabetes mellitus without complications: Secondary | ICD-10-CM | POA: Diagnosis not present

## 2020-01-14 DIAGNOSIS — Z981 Arthrodesis status: Secondary | ICD-10-CM | POA: Insufficient documentation

## 2020-01-14 DIAGNOSIS — Z91040 Latex allergy status: Secondary | ICD-10-CM | POA: Insufficient documentation

## 2020-01-14 DIAGNOSIS — M1990 Unspecified osteoarthritis, unspecified site: Secondary | ICD-10-CM | POA: Insufficient documentation

## 2020-01-14 DIAGNOSIS — Z888 Allergy status to other drugs, medicaments and biological substances status: Secondary | ICD-10-CM | POA: Diagnosis not present

## 2020-01-14 DIAGNOSIS — M797 Fibromyalgia: Secondary | ICD-10-CM | POA: Insufficient documentation

## 2020-01-14 DIAGNOSIS — Z79899 Other long term (current) drug therapy: Secondary | ICD-10-CM | POA: Insufficient documentation

## 2020-01-14 DIAGNOSIS — G709 Myoneural disorder, unspecified: Secondary | ICD-10-CM | POA: Diagnosis not present

## 2020-01-14 DIAGNOSIS — K640 First degree hemorrhoids: Secondary | ICD-10-CM | POA: Diagnosis not present

## 2020-01-14 HISTORY — PX: COLONOSCOPY WITH PROPOFOL: SHX5780

## 2020-01-14 SURGERY — COLONOSCOPY WITH PROPOFOL
Anesthesia: General

## 2020-01-14 MED ORDER — PROPOFOL 10 MG/ML IV BOLUS
INTRAVENOUS | Status: AC
  Filled 2020-01-14: qty 20

## 2020-01-14 MED ORDER — PROPOFOL 10 MG/ML IV BOLUS
INTRAVENOUS | Status: DC | PRN
  Administered 2020-01-14: 40 mg via INTRAVENOUS

## 2020-01-14 MED ORDER — PROPOFOL 500 MG/50ML IV EMUL
INTRAVENOUS | Status: DC | PRN
  Administered 2020-01-14: 150 ug/kg/min via INTRAVENOUS

## 2020-01-14 MED ORDER — SODIUM CHLORIDE 0.9 % IV SOLN
INTRAVENOUS | Status: DC
  Administered 2020-01-14: 14:00:00 1000 mL via INTRAVENOUS

## 2020-01-14 MED ORDER — LIDOCAINE HCL (CARDIAC) PF 100 MG/5ML IV SOSY
PREFILLED_SYRINGE | INTRAVENOUS | Status: DC | PRN
  Administered 2020-01-14: 50 mg via INTRAVENOUS

## 2020-01-14 MED ORDER — PROPOFOL 500 MG/50ML IV EMUL
INTRAVENOUS | Status: AC
  Filled 2020-01-14: qty 50

## 2020-01-14 NOTE — H&P (Signed)
Jonathon Bellows, MD [address], [place], [place], [place], [address] [address], [place], [place], [place], [address] Phone: [phone]  Fax: [phone]  Primary Care Physician:  Doreen Beam, FNP   Pre-Procedure History & Physical: HPI:  Debra Arnold is a 64 y.o. female is here for an colonoscopy.   Past Medical History:  Diagnosis Date  . Diabetes mellitus without complication [facility])     Past Surgical History:  Procedure Laterality Date  . ABDOMINAL HYSTERECTOMY    . APPENDECTOMY    . BACK SURGERY    . CHOLECYSTECTOMY    . FOOT SURGERY Left   . HAND SURGERY Right   . SPINAL FUSION    . TONSILLECTOMY    . TUBAL LIGATION      Prior to Admission medications   Medication Sig Start Date End Date Taking? Authorizing Provider  ADDERALL XR 30 MG 24 hr capsule Take 30 mg by mouth 2 (two) times daily. 11/27/19  Yes [provider]  aluminum-magnesium hydroxide-simethicone (MAALOX) 546-270-35 MG/5ML SUSP Take 30 mLs by mouth 4 (four) times daily -  before meals and at bedtime. 12/10/19  Yes Carrie Mew, MD  meclizine (ANTIVERT) 25 MG tablet Take 1 tablet (25 mg total) by mouth 3 (three) times daily as needed for dizziness. 07/28/19  Yes Vanessa Weatherford, MD  metFORMIN (GLUCOPHAGE) 500 MG tablet Take 500 mg by mouth 2 (two) times daily with a meal.   Yes [provider]  metoCLOPramide (REGLAN) 10 MG tablet Take 1 tablet (10 mg total) by mouth every 6 (six) hours as needed. 12/10/19  Yes Carrie Mew, MD  pantoprazole (PROTONIX) 40 MG tablet Take 40 mg by mouth daily. 11/16/19  Yes [provider]  sucralfate (CARAFATE) 1 g tablet Take 1 g by mouth 3 (three) times daily. 11/16/19  Yes [provider]  glipiZIDE (GLUCOTROL) 5 MG tablet Take 1 tablet (5 mg total) by mouth daily. 12/10/19 01/09/20  Carrie Mew, MD    Allergies as of 12/25/2019 - Review Complete 12/18/2019  Allergen Reaction Noted  . Demerol [meperidine  hcl] Nausea And Vomiting 07/28/2019  . Latex Hives and Rash 07/28/2019  . Metformin and related Diarrhea 12/10/2019    History reviewed. No pertinent family history.  Social History   Socioeconomic History  . Marital status: Single    Spouse name: Not on file  . Number of children: Not on file  . Years of education: Not on file  . Highest education level: Not on file  Occupational History  . Not on file  Tobacco Use  . Smoking status: Former Research scientist (life sciences)  . Smokeless tobacco: Never Used  Substance and Sexual Activity  . Alcohol use: Not Currently  . Drug use: Never  . Sexual activity: Not on file  Other Topics Concern  . Not on file  Social History Narrative  . Not on file   Social Determinants of Health   Financial Resource Strain:   . Difficulty of Paying Living Expenses:   Food Insecurity:   . Worried About Charity fundraiser in the Last Year:   . Arboriculturist in the Last Year:   Transportation Needs:   . Film/video editor (Medical):   Marland Kitchen Lack of Transportation (Non-Medical):   Physical Activity:   . Days of Exercise per Week:   . Minutes of Exercise per Session:   Stress:   . Feeling of Stress :   Social Connections:   .  Frequency of Communication with Friends and Family:   . Frequency of Social Gatherings with Friends and Family:   . Attends Religious Services:   . Active Member of Clubs or Organizations:   . Attends Banker Meetings:   Marland Kitchen Marital Status:   Intimate Partner Violence:   . Fear of Current or Ex-Partner:   . Emotionally Abused:   Marland Kitchen Physically Abused:   . Sexually Abused:     Review of Systems: See HPI, otherwise negative ROS  Physical Exam: BP 115/78   Pulse 90   Temp (!) 97 F (36.1 C) (Temporal)   Resp 18   Ht 5\' 6"  (1.676 m)   Wt 74.1 kg   SpO2 99%   BMI 26.36 kg/m  General:   Alert,  pleasant and cooperative in NAD Head:  Normocephalic and atraumatic. Neck:  Supple; no masses or thyromegaly. Lungs:  Clear  throughout to auscultation, normal respiratory effort.    Heart:  +S1, +S2, Regular rate and rhythm, No edema. Abdomen:  Soft, nontender and nondistended. Normal bowel sounds, without guarding, and without rebound.   Neurologic:  Alert and  oriented x4;  grossly normal neurologically.  Impression/Plan: is here for an colonoscopy to be performed for RUQ pain.   Risks, benefits, limitations, and alternatives regarding  colonoscopy have been reviewed with the patient.  Questions have been answered.  All parties agreeable.   [name], MD  01/14/2020, 2:20 PM

## 2020-01-14 NOTE — Op Note (Signed)
[facility] Gastroenterology Patient Name: Debra Arnold Procedure Date: 01/14/2020 2:23 PM MRN: [id number] Account #: [id number] Date of Birth: 1956-06-06 Admit Type: Outpatient Age: 64 Room: [facility] ENDO ROOM 1 Gender: Female Note Status: Finalized Procedure:             Colonoscopy Indications:           Abdominal pain in the right upper quadrant Providers:             Jonathon Bellows MD, MD Referring MD:          No Local Md, MD (Referring MD) Medicines:             Monitored Anesthesia Care Complications:         No immediate complications. Procedure:             Pre-Anesthesia Assessment:                        - Prior to the procedure, a History and Physical was                         performed, and patient medications, allergies and                         sensitivities were reviewed. The patient's tolerance                         of previous anesthesia was reviewed.                        - The risks and benefits of the procedure and the                         sedation options and risks were discussed with the                         patient. All questions were answered and informed                         consent was obtained.                        - ASA Grade Assessment: II - A patient with mild                         systemic disease.                        After obtaining informed consent, the colonoscope was                         passed under direct vision. Throughout the procedure,                         the patient's blood pressure, pulse, and oxygen                         saturations were monitored continuously. The                         Colonoscope was introduced through  the anus and                         advanced to the the cecum, identified by the                         appendiceal orifice. The colonoscopy was performed                         with ease. The patient tolerated the procedure well.                         The quality of  the bowel preparation was excellent. Findings:      The perianal and digital rectal examinations were normal.      Non-bleeding internal hemorrhoids were found during retroflexion. The       hemorrhoids were medium-sized and Grade I (internal hemorrhoids that do       not prolapse).      The exam was otherwise without abnormality on direct and retroflexion       views. Impression:            - Non-bleeding internal hemorrhoids.                        - The examination was otherwise normal on direct and                         retroflexion views.                        - No specimens collected. Recommendation:        - Discharge patient to home (with escort).                        - Resume previous diet.                        - Continue present medications.                        - Repeat colonoscopy in 10 years for screening                         purposes. Procedure Code(s):     --- Professional ---                        [phone], Colonoscopy, flexible; diagnostic, including                         collection of specimen(s) by brushing or washing, when                         performed (separate procedure) Diagnosis Code(s):     --- Professional ---                        K64.0, First degree hemorrhoids                        R10.11, Right upper quadrant pain CPT copyright 2019 American Medical Association. All rights reserved. The codes  documented in this report are preliminary and upon coder review may  be revised to meet current compliance requirements. Wyline Mood, MD Wyline Mood MD, MD 01/14/2020 2:46:28 PM This report has been signed electronically. Number of Addenda: 0 Note Initiated On: 01/14/2020 2:23 PM Scope Withdrawal Time: 0 hours 10 minutes 1 second  Total Procedure Duration: 0 hours 15 minutes 28 seconds  Estimated Blood Loss:  Estimated blood loss: none.      [facility]

## 2020-01-14 NOTE — Transfer of Care (Signed)
Immediate Anesthesia Transfer of Care Note  Patient: Debra Arnold  Procedure(s) Performed: COLONOSCOPY WITH PROPOFOL (N/A )  Patient Location: PACU  Anesthesia Type:General  Level of Consciousness: awake, alert  and oriented  Airway & Oxygen Therapy: Patient Spontanous Breathing and Patient connected to nasal cannula oxygen  Post-op Assessment: Report given to RN and Post -op Vital signs reviewed and stable  Post vital signs: Reviewed and stable  Last Vitals:  Vitals Value Taken Time  BP 104/67 01/14/20 1448  Temp    Pulse 80 01/14/20 1448  Resp 12 01/14/20 1448  SpO2 97 % 01/14/20 1448  Vitals shown include unvalidated device data.  Last Pain:  Vitals:   01/14/20 1334  TempSrc: Temporal         Complications: No apparent anesthesia complications

## 2020-01-14 NOTE — Anesthesia Preprocedure Evaluation (Addendum)
Anesthesia Evaluation  Patient identified by MRN, date of birth, ID band Patient awake    Reviewed: Allergy & Precautions, NPO status , Patient's Chart, lab work & pertinent test results  Airway Mallampati: II       Dental  (+) Partial Upper   Pulmonary former smoker,    Pulmonary exam normal        Cardiovascular negative cardio ROS Normal cardiovascular exam     Neuro/Psych PSYCHIATRIC DISORDERS Depression  Neuromuscular disease    GI/Hepatic Neg liver ROS, GERD  ,  Endo/Other  diabetes  Renal/GU negative Renal ROS  negative genitourinary   Musculoskeletal  (+) Arthritis , Fibromyalgia -  Abdominal Normal abdominal exam  (+)   Peds negative pediatric ROS (+)  Hematology negative hematology ROS (+)   Anesthesia Other Findings   Reproductive/Obstetrics                            Anesthesia Physical Anesthesia Plan  ASA: II  Anesthesia Plan: General   Post-op Pain Management:    Induction: Intravenous  PONV Risk Score and Plan:   Airway Management Planned: Nasal Cannula  Additional Equipment:   Intra-op Plan:   Post-operative Plan:   Informed Consent: I have reviewed the patients History and Physical, chart, labs and discussed the procedure including the risks, benefits and alternatives for the proposed anesthesia with the patient or authorized representative who has indicated his/her understanding and acceptance.     Dental advisory given  Plan Discussed with: CRNA and Surgeon  Anesthesia Plan Comments:         Anesthesia Quick Evaluation

## 2020-01-15 ENCOUNTER — Encounter: Payer: Self-pay | Admitting: *Deleted

## 2020-01-15 NOTE — Anesthesia Postprocedure Evaluation (Signed)
Anesthesia Post Note  Patient: Debra Arnold  Procedure(s) Performed: COLONOSCOPY WITH PROPOFOL (N/A )  Patient location during evaluation: Endoscopy Anesthesia Type: General Level of consciousness: awake and alert and oriented Pain management: pain level controlled Vital Signs Assessment: post-procedure vital signs reviewed and stable Respiratory status: spontaneous breathing Cardiovascular status: blood pressure returned to baseline Anesthetic complications: no     Last Vitals:  Vitals:   01/14/20 1458 01/14/20 1518  BP: 109/71 (!) 152/75  Pulse:    Resp:    Temp:    SpO2:      Last Pain:  Vitals:   01/14/20 1518  TempSrc:   PainSc: 0-No pain                 Makinlee Awwad

## 2020-02-02 DIAGNOSIS — K760 Fatty (change of) liver, not elsewhere classified: Secondary | ICD-10-CM | POA: Diagnosis not present

## 2020-02-02 DIAGNOSIS — Z0000 Encounter for general adult medical examination without abnormal findings: Secondary | ICD-10-CM | POA: Diagnosis not present

## 2020-02-02 DIAGNOSIS — M792 Neuralgia and neuritis, unspecified: Secondary | ICD-10-CM | POA: Diagnosis not present

## 2020-02-02 DIAGNOSIS — R5383 Other fatigue: Secondary | ICD-10-CM | POA: Diagnosis not present

## 2020-02-02 DIAGNOSIS — E1165 Type 2 diabetes mellitus with hyperglycemia: Secondary | ICD-10-CM | POA: Diagnosis not present

## 2020-02-03 DIAGNOSIS — R5383 Other fatigue: Secondary | ICD-10-CM | POA: Diagnosis not present

## 2020-02-03 DIAGNOSIS — K760 Fatty (change of) liver, not elsewhere classified: Secondary | ICD-10-CM | POA: Diagnosis not present

## 2020-02-03 DIAGNOSIS — M792 Neuralgia and neuritis, unspecified: Secondary | ICD-10-CM | POA: Diagnosis not present

## 2020-02-03 DIAGNOSIS — E1165 Type 2 diabetes mellitus with hyperglycemia: Secondary | ICD-10-CM | POA: Diagnosis not present

## 2020-02-11 ENCOUNTER — Ambulatory Visit: Payer: Self-pay | Admitting: Adult Health

## 2020-02-11 DIAGNOSIS — E1165 Type 2 diabetes mellitus with hyperglycemia: Secondary | ICD-10-CM | POA: Diagnosis not present

## 2020-03-04 DIAGNOSIS — Z0000 Encounter for general adult medical examination without abnormal findings: Secondary | ICD-10-CM | POA: Diagnosis not present

## 2020-03-04 DIAGNOSIS — Z608 Other problems related to social environment: Secondary | ICD-10-CM | POA: Diagnosis not present

## 2020-03-04 DIAGNOSIS — W57XXXA Bitten or stung by nonvenomous insect and other nonvenomous arthropods, initial encounter: Secondary | ICD-10-CM | POA: Diagnosis not present

## 2020-03-04 DIAGNOSIS — Z23 Encounter for immunization: Secondary | ICD-10-CM | POA: Diagnosis not present

## 2020-03-04 DIAGNOSIS — M792 Neuralgia and neuritis, unspecified: Secondary | ICD-10-CM | POA: Diagnosis not present

## 2020-03-05 ENCOUNTER — Encounter (INDEPENDENT_AMBULATORY_CARE_PROVIDER_SITE_OTHER): Payer: Self-pay

## 2020-03-18 ENCOUNTER — Other Ambulatory Visit: Payer: Self-pay | Admitting: Family Medicine

## 2020-03-18 DIAGNOSIS — Z1231 Encounter for screening mammogram for malignant neoplasm of breast: Secondary | ICD-10-CM

## 2020-04-06 DIAGNOSIS — R0781 Pleurodynia: Secondary | ICD-10-CM | POA: Diagnosis not present

## 2020-04-08 ENCOUNTER — Other Ambulatory Visit: Payer: Self-pay | Admitting: Acute Care

## 2020-04-08 DIAGNOSIS — H02831 Dermatochalasis of right upper eyelid: Secondary | ICD-10-CM | POA: Diagnosis not present

## 2020-04-08 DIAGNOSIS — H43811 Vitreous degeneration, right eye: Secondary | ICD-10-CM | POA: Diagnosis not present

## 2020-04-08 DIAGNOSIS — H2513 Age-related nuclear cataract, bilateral: Secondary | ICD-10-CM | POA: Diagnosis not present

## 2020-04-08 DIAGNOSIS — R0781 Pleurodynia: Secondary | ICD-10-CM

## 2020-04-08 DIAGNOSIS — E113293 Type 2 diabetes mellitus with mild nonproliferative diabetic retinopathy without macular edema, bilateral: Secondary | ICD-10-CM | POA: Diagnosis not present

## 2020-04-08 DIAGNOSIS — Z7984 Long term (current) use of oral hypoglycemic drugs: Secondary | ICD-10-CM | POA: Diagnosis not present

## 2020-04-09 DIAGNOSIS — H5213 Myopia, bilateral: Secondary | ICD-10-CM | POA: Diagnosis not present

## 2020-04-22 ENCOUNTER — Other Ambulatory Visit: Payer: Self-pay

## 2020-04-22 ENCOUNTER — Ambulatory Visit
Admission: RE | Admit: 2020-04-22 | Discharge: 2020-04-22 | Disposition: A | Discharge status: Home / Self Care | Payer: Medicare Other | Source: Ambulatory Visit | Attending: Acute Care | Admitting: Acute Care

## 2020-04-22 DIAGNOSIS — M5124 Other intervertebral disc displacement, thoracic region: Secondary | ICD-10-CM | POA: Diagnosis not present

## 2020-04-22 DIAGNOSIS — G9589 Other specified diseases of spinal cord: Secondary | ICD-10-CM | POA: Diagnosis not present

## 2020-04-22 DIAGNOSIS — R0781 Pleurodynia: Secondary | ICD-10-CM | POA: Insufficient documentation

## 2020-04-22 DIAGNOSIS — R200 Anesthesia of skin: Secondary | ICD-10-CM | POA: Diagnosis not present

## 2020-04-22 DIAGNOSIS — M5134 Other intervertebral disc degeneration, thoracic region: Secondary | ICD-10-CM | POA: Diagnosis not present

## 2020-04-30 ENCOUNTER — Encounter (INDEPENDENT_AMBULATORY_CARE_PROVIDER_SITE_OTHER): Payer: Self-pay

## 2020-05-11 ENCOUNTER — Other Ambulatory Visit: Payer: Self-pay | Admitting: Surgery

## 2020-05-11 DIAGNOSIS — R0781 Pleurodynia: Secondary | ICD-10-CM

## 2020-05-19 ENCOUNTER — Ambulatory Visit: Payer: Medicare Other

## 2020-05-21 ENCOUNTER — Ambulatory Visit: Payer: Medicare Other | Attending: Surgery

## 2020-06-08 ENCOUNTER — Other Ambulatory Visit: Payer: Self-pay

## 2020-06-08 ENCOUNTER — Ambulatory Visit
Admission: RE | Admit: 2020-06-08 | Discharge: 2020-06-08 | Disposition: A | Discharge status: Home / Self Care | Payer: Medicare Other | Source: Ambulatory Visit | Attending: Family Medicine | Admitting: Family Medicine

## 2020-06-08 ENCOUNTER — Other Ambulatory Visit: Payer: Self-pay | Admitting: Family Medicine

## 2020-06-08 DIAGNOSIS — Z1231 Encounter for screening mammogram for malignant neoplasm of breast: Secondary | ICD-10-CM

## 2020-06-15 ENCOUNTER — Ambulatory Visit
Admission: RE | Admit: 2020-06-15 | Discharge: 2020-06-15 | Disposition: A | Discharge status: Home / Self Care | Payer: Medicare Other | Source: Ambulatory Visit | Attending: Surgery | Admitting: Surgery

## 2020-06-15 ENCOUNTER — Other Ambulatory Visit: Payer: Self-pay

## 2020-06-15 DIAGNOSIS — R0781 Pleurodynia: Secondary | ICD-10-CM | POA: Diagnosis not present

## 2020-12-23 ENCOUNTER — Other Ambulatory Visit: Payer: Self-pay | Admitting: Family Medicine

## 2020-12-23 DIAGNOSIS — N644 Mastodynia: Secondary | ICD-10-CM

## 2021-01-28 ENCOUNTER — Ambulatory Visit
Admission: RE | Admit: 2021-01-28 | Discharge: 2021-01-28 | Disposition: A | Discharge status: Home / Self Care | Payer: Medicare Other | Source: Ambulatory Visit | Attending: Family Medicine | Admitting: Family Medicine

## 2021-01-28 ENCOUNTER — Other Ambulatory Visit: Payer: Self-pay

## 2021-01-28 DIAGNOSIS — N644 Mastodynia: Secondary | ICD-10-CM

## 2021-03-29 ENCOUNTER — Encounter: Payer: Self-pay | Admitting: General Surgery

## 2021-03-30 ENCOUNTER — Encounter: Payer: Self-pay | Admitting: General Surgery

## 2021-03-30 ENCOUNTER — Ambulatory Visit: Payer: Medicare Other | Admitting: Anesthesiology

## 2021-03-30 ENCOUNTER — Encounter
Admission: RE | Disposition: A | Discharge status: Home / Self Care | Payer: Self-pay | Source: Home / Self Care | Attending: General Surgery

## 2021-03-30 ENCOUNTER — Other Ambulatory Visit: Payer: Self-pay

## 2021-03-30 ENCOUNTER — Ambulatory Visit
Admission: RE | Admit: 2021-03-30 | Discharge: 2021-03-30 | Disposition: A | Discharge status: Home / Self Care | Payer: Medicare Other | Attending: General Surgery | Admitting: General Surgery

## 2021-03-30 DIAGNOSIS — Z91040 Latex allergy status: Secondary | ICD-10-CM | POA: Diagnosis not present

## 2021-03-30 DIAGNOSIS — Z7984 Long term (current) use of oral hypoglycemic drugs: Secondary | ICD-10-CM | POA: Insufficient documentation

## 2021-03-30 DIAGNOSIS — E119 Type 2 diabetes mellitus without complications: Secondary | ICD-10-CM | POA: Diagnosis not present

## 2021-03-30 DIAGNOSIS — Z87891 Personal history of nicotine dependence: Secondary | ICD-10-CM | POA: Insufficient documentation

## 2021-03-30 DIAGNOSIS — R1310 Dysphagia, unspecified: Secondary | ICD-10-CM | POA: Insufficient documentation

## 2021-03-30 DIAGNOSIS — Z79899 Other long term (current) drug therapy: Secondary | ICD-10-CM | POA: Insufficient documentation

## 2021-03-30 DIAGNOSIS — R1011 Right upper quadrant pain: Secondary | ICD-10-CM | POA: Diagnosis not present

## 2021-03-30 DIAGNOSIS — K2100 Gastro-esophageal reflux disease with esophagitis, without bleeding: Secondary | ICD-10-CM | POA: Diagnosis not present

## 2021-03-30 DIAGNOSIS — Z885 Allergy status to narcotic agent status: Secondary | ICD-10-CM | POA: Insufficient documentation

## 2021-03-30 HISTORY — DX: Other specified postprocedural states: Z98.890

## 2021-03-30 HISTORY — DX: Depression, unspecified: F32.A

## 2021-03-30 HISTORY — DX: Gastro-esophageal reflux disease without esophagitis: K21.9

## 2021-03-30 HISTORY — DX: Nausea with vomiting, unspecified: R11.2

## 2021-03-30 HISTORY — DX: Noninfective gastroenteritis and colitis, unspecified: K52.9

## 2021-03-30 HISTORY — DX: Unspecified asthma, uncomplicated: J45.909

## 2021-03-30 HISTORY — DX: Unspecified osteoarthritis, unspecified site: M19.90

## 2021-03-30 HISTORY — PX: ESOPHAGOGASTRODUODENOSCOPY (EGD) WITH PROPOFOL: SHX5813

## 2021-03-30 HISTORY — DX: Hidradenitis suppurativa: L73.2

## 2021-03-30 LAB — GLUCOSE, CAPILLARY: Glucose-Capillary: 279 mg/dL — ABNORMAL HIGH (ref 70–99)

## 2021-03-30 SURGERY — ESOPHAGOGASTRODUODENOSCOPY (EGD) WITH PROPOFOL
Anesthesia: General

## 2021-03-30 MED ORDER — PROPOFOL 500 MG/50ML IV EMUL
INTRAVENOUS | Status: DC | PRN
  Administered 2021-03-30: 175 ug/kg/min via INTRAVENOUS

## 2021-03-30 MED ORDER — PHENYLEPHRINE HCL (PRESSORS) 10 MG/ML IV SOLN
INTRAVENOUS | Status: DC | PRN
  Administered 2021-03-30: 100 ug via INTRAVENOUS

## 2021-03-30 MED ORDER — LIDOCAINE HCL (CARDIAC) PF 100 MG/5ML IV SOSY
PREFILLED_SYRINGE | INTRAVENOUS | Status: DC | PRN
  Administered 2021-03-30: 40 mg via INTRAVENOUS

## 2021-03-30 MED ORDER — SODIUM CHLORIDE 0.9 % IV SOLN: INTRAVENOUS | Status: DC

## 2021-03-30 MED ORDER — PROPOFOL 10 MG/ML IV BOLUS
INTRAVENOUS | Status: DC | PRN
  Administered 2021-03-30: 10 mg via INTRAVENOUS
  Administered 2021-03-30: 80 mg via INTRAVENOUS

## 2021-03-30 MED ORDER — PROPOFOL 500 MG/50ML IV EMUL
INTRAVENOUS | Status: AC
  Filled 2021-03-30: qty 50

## 2021-03-30 NOTE — Anesthesia Procedure Notes (Signed)
Date/Time: 03/30/2021 10:15 AM Performed by: Stormy Fabian, CRNA Pre-anesthesia Checklist: Patient identified, Emergency Drugs available, Suction available and Patient being monitored Patient Re-evaluated:Patient Re-evaluated prior to induction Oxygen Delivery Method: Nasal cannula Induction Type: IV induction Dental Injury: Teeth and Oropharynx as per pre-operative assessment  Comments: Nasal cannula with etCO2 monitoring

## 2021-03-30 NOTE — Op Note (Signed)
[facility] Gastroenterology Patient Name: Debra Arnold Procedure Date: 03/30/2021 10:04 AM MRN: [id number] Account #: [id number] Date of Birth: [birth year] Admit Type: Outpatient Age: 65 Room: [facility] ENDO ROOM 1 Gender: Female Note Status: Finalized Procedure:             Upper GI endoscopy Indications:           Abdominal pain in the right upper quadrant, Dysphagia Providers:             Earline Mayotte, MD Medicines:             Monitored Anesthesia Care Complications:         No immediate complications. Procedure:             Pre-Anesthesia Assessment:                        - Prior to the procedure, a History and Physical was                         performed, and patient medications, allergies and                         sensitivities were reviewed. The patient's tolerance                         of previous anesthesia was reviewed.                        - The risks and benefits of the procedure and the                         sedation options and risks were discussed with the                         patient. All questions were answered and informed                         consent was obtained.                        After obtaining informed consent, the endoscope was                         passed under direct vision. Throughout the procedure,                         the patient's blood pressure, pulse, and oxygen                         saturations were monitored continuously. The Endoscope                         was introduced through the mouth, and advanced to the                         third part of duodenum. The upper GI endoscopy was                         accomplished without difficulty. The patient tolerated  the procedure well. Findings:      LA Grade A (one or more mucosal breaks less than 5 mm, not extending       between tops of 2 mucosal folds) esophagitis with no bleeding was found       30 cm from the incisors.  Biopsies were taken with a cold forceps for       histology.      A medium amount of food (residue) was found on the lesser curvature of       the stomach.      The examined duodenum was normal. Impression:            - LA Grade A esophagitis with no bleeding. Biopsied.                        - A medium amount of food (residue) in the stomach.                        - Normal examined duodenum. Recommendation:        - Return to GI clinic in 2 weeks. Procedure Code(s):     --- Professional ---                        [phone], Esophagogastroduodenoscopy, flexible,                         transoral; with biopsy, single or multiple Diagnosis Code(s):     --- Professional ---                        K20.90, Esophagitis, unspecified without bleeding                        R10.11, Right upper quadrant pain                        R13.10, Dysphagia, unspecified CPT copyright 2019 American Medical Association. All rights reserved. The codes documented in this report are preliminary and upon coder review may  be revised to meet current compliance requirements. Earline Mayotte, MD 03/30/2021 10:35:43 AM This report has been signed electronically. Number of Addenda: 0 Note Initiated On: 03/30/2021 10:04 AM Estimated Blood Loss:  Estimated blood loss: none.      [facility]

## 2021-03-30 NOTE — Anesthesia Postprocedure Evaluation (Signed)
Anesthesia Post Note  Patient: Debra Arnold  Procedure(s) Performed: ESOPHAGOGASTRODUODENOSCOPY (EGD) WITH PROPOFOL  Patient location during evaluation: Endoscopy Anesthesia Type: General Level of consciousness: awake and alert Pain management: pain level controlled Vital Signs Assessment: post-procedure vital signs reviewed and stable Respiratory status: spontaneous breathing, nonlabored ventilation, respiratory function stable and patient connected to nasal cannula oxygen Cardiovascular status: blood pressure returned to baseline and stable Postop Assessment: no apparent nausea or vomiting Anesthetic complications: no   No notable events documented.   Last Vitals:  Vitals:   03/30/21 1100 03/30/21 1110  BP: (!) 97/48 (!) 107/52  Pulse: 74 74  Resp: 20 20  Temp:    SpO2: 96% 97%    Last Pain:  Vitals:   03/30/21 1030  TempSrc: Temporal  PainSc:                  Cleda Mccreedy Eliot Popper

## 2021-03-30 NOTE — H&P (Signed)
Debra Arnold [id number] September 22, 1956     HPI: 18 month history of RUQ pain, unrelated to meals or activity. Dissimilar to what she experienced prior to her cholecystectomy in 2016. No improvement w/ PPI in the past.  For EGD.   Medications Prior to Admission  Medication Sig Dispense Refill Last Dose   ADDERALL XR 30 MG 24 hr capsule Take 30 mg by mouth 2 (two) times daily.   03/29/2021   Dulaglutide (TRULICITY) 0.75 MG/0.5ML SOPN Inject 0.75 mg into the skin once a week.   Past Week   empagliflozin (JARDIANCE) 10 MG TABS tablet Take 10 mg by mouth daily.   03/29/2021   esomeprazole (NEXIUM) 40 MG capsule Take 40 mg by mouth daily. Take 30 minutes before breakfast      FLUoxetine (PROZAC) 20 MG tablet Take 20 mg by mouth daily.   03/29/2021   metFORMIN (GLUCOPHAGE) 500 MG tablet Take 500 mg by mouth 2 (two) times daily with a meal.   03/29/2021   pravastatin (PRAVACHOL) 20 MG tablet Take 20 mg by mouth daily.   03/29/2021   aluminum-magnesium hydroxide-simethicone (MAALOX) 200-200-20 MG/5ML SUSP Take 30 mLs by mouth 4 (four) times daily -  before meals and at bedtime. 355 mL 0    glipiZIDE (GLUCOTROL) 5 MG tablet Take 1 tablet (5 mg total) by mouth daily. 30 tablet 1    meclizine (ANTIVERT) 25 MG tablet Take 1 tablet (25 mg total) by mouth 3 (three) times daily as needed for dizziness. 30 tablet 0    metoCLOPramide (REGLAN) 10 MG tablet Take 1 tablet (10 mg total) by mouth every 6 (six) hours as needed. (Patient not taking: No sig reported) 30 tablet 0 Not Taking   pantoprazole (PROTONIX) 40 MG tablet Take 40 mg by mouth daily. (Patient not taking: Reported on 03/29/2021)   Not Taking   sucralfate (CARAFATE) 1 g tablet Take 1 g by mouth 3 (three) times daily. (Patient not taking: No sig reported)   Not Taking   Allergies  Allergen Reactions   Demerol [Meperidine Hcl] Nausea And Vomiting   Latex Hives and Rash   Metformin And Related Diarrhea    In high doses    Past Medical History:   Diagnosis Date   Arthritis    Asthma    Colitis    Depression    Diabetes mellitus without complication (HCC)    GERD (gastroesophageal reflux disease)    Hydradenitis    PONV (postoperative nausea and vomiting)    Past Surgical History:  Procedure Laterality Date   ABDOMINAL HYSTERECTOMY     APPENDECTOMY     BACK SURGERY     CHOLECYSTECTOMY     COLONOSCOPY WITH PROPOFOL N/A 01/14/2020   Procedure: COLONOSCOPY WITH PROPOFOL;  Surgeon: Wyline Mood, MD;  Location: [facility] ENDOSCOPY;  Service: Gastroenterology;  Laterality: N/A;   FOOT SURGERY Left    HAND SURGERY Right    HYDRADENITIS EXCISION     SPINAL FUSION     TONSILLECTOMY     TUBAL LIGATION     Social History   Socioeconomic History   Marital status: Single    Spouse name: Not on file   Number of children: Not on file   Years of education: Not on file   Highest education level: Not on file  Occupational History   Not on file  Tobacco Use   Smoking status: Former    Pack years: 0.00   Smokeless tobacco: Never  Vaping Use  Vaping Use: Never used  Substance and Sexual Activity   Alcohol use: Not Currently   Drug use: Never   Sexual activity: Not on file  Other Topics Concern   Not on file  Social History Narrative   Not on file   Social Determinants of Health   Financial Resource Strain: Not on file  Food Insecurity: Not on file  Transportation Needs: Not on file  Physical Activity: Not on file  Stress: Not on file  Social Connections: Not on file  Intimate Partner Violence: Not on file   Social History   Social History Narrative   Not on file     ROS: Negative.     PE: HEENT: Negative. Lungs: Clear. Cardio: RR.   Assessment/Plan:  Proceed with planned endoscopy.    Merrily P[facility] 03/30/2021

## 2021-03-30 NOTE — Transfer of Care (Signed)
Immediate Anesthesia Transfer of Care Note  Patient: CASSIDIE VEIGA  Procedure(s) Performed: ESOPHAGOGASTRODUODENOSCOPY (EGD) WITH PROPOFOL  Patient Location: PACU and Endoscopy Unit  Anesthesia Type:General  Level of Consciousness: drowsy  Airway & Oxygen Therapy: Patient Spontanous Breathing  Post-op Assessment: Report given to RN and Post -op Vital signs reviewed and stable  Post vital signs: Reviewed and stable  Last Vitals:  Vitals Value Taken Time  BP 98/53 03/30/21 1039  Temp    Pulse 74 03/30/21 1040  Resp 13 03/30/21 1040  SpO2 95 % 03/30/21 1040  Vitals shown include unvalidated device data.  Last Pain:  Vitals:   03/30/21 1030  TempSrc: Temporal  PainSc:          Complications: No notable events documented.

## 2021-03-30 NOTE — Anesthesia Preprocedure Evaluation (Signed)
Anesthesia Evaluation  Patient identified by MRN, date of birth, ID band Patient awake    Reviewed: Allergy & Precautions, NPO status , Patient's Chart, lab work & pertinent test results  History of Anesthesia Complications (+) PONV and history of anesthetic complications  Airway Mallampati: III  TM Distance: <3 FB Neck ROM: limited    Dental  (+) Chipped, Partial Upper   Pulmonary neg shortness of breath, asthma , former smoker,    Pulmonary exam normal        Cardiovascular Exercise Tolerance: Good (-) angina(-) Past MI and (-) DOE negative cardio ROS Normal cardiovascular exam     Neuro/Psych PSYCHIATRIC DISORDERS  Neuromuscular disease    GI/Hepatic Neg liver ROS, GERD  Medicated and Controlled,  Endo/Other  negative endocrine ROSdiabetes  Renal/GU negative Renal ROS  negative genitourinary   Musculoskeletal   Abdominal   Peds  Hematology negative hematology ROS (+)   Anesthesia Other Findings Past Medical History: No date: Arthritis No date: Asthma No date: Colitis No date: Depression No date: Diabetes mellitus without complication (HCC) No date: GERD (gastroesophageal reflux disease) No date: Hydradenitis No date: PONV (postoperative nausea and vomiting)  Past Surgical History: No date: ABDOMINAL HYSTERECTOMY No date: APPENDECTOMY No date: BACK SURGERY No date: CHOLECYSTECTOMY 01/14/2020: COLONOSCOPY WITH PROPOFOL; N/A     Comment:  Procedure: COLONOSCOPY WITH PROPOFOL;  Surgeon: Wyline Mood, MD;  Location: [facility];  Service:               Gastroenterology;  Laterality: N/A; No date: FOOT SURGERY; Left No date: HAND SURGERY; Right No date: HYDRADENITIS EXCISION No date: SPINAL FUSION No date: TONSILLECTOMY No date: TUBAL LIGATION  BMI    Body Mass Index: 24.86 kg/m      Reproductive/Obstetrics negative OB ROS                              Anesthesia Physical Anesthesia Plan  ASA: 3  Anesthesia Plan: General   Post-op Pain Management:    Induction: Intravenous  PONV Risk Score and Plan: Propofol infusion and TIVA  Airway Management Planned: Natural Airway and Nasal Cannula  Additional Equipment:   Intra-op Plan:   Post-operative Plan:   Informed Consent: I have reviewed the patients History and Physical, chart, labs and discussed the procedure including the risks, benefits and alternatives for the proposed anesthesia with the patient or authorized representative who has indicated his/her understanding and acceptance.     Dental Advisory Given  Plan Discussed with: Anesthesiologist, CRNA and Surgeon  Anesthesia Plan Comments: (Patient consented for risks of anesthesia including but not limited to:  - adverse reactions to medications - risk of airway placement if required - damage to eyes, teeth, lips or other oral mucosa - nerve damage due to positioning  - sore throat or hoarseness - Damage to heart, brain, nerves, lungs, other parts of body or loss of life  Patient voiced understanding.)        Anesthesia Quick Evaluation

## 2021-03-31 ENCOUNTER — Encounter: Payer: Self-pay | Admitting: General Surgery

## 2021-03-31 LAB — SURGICAL PATHOLOGY

## 2021-09-14 IMAGING — MR MR HEAD W/O CM
10 series · 48 of 48 positions shown · non-contrast
Comparison: None.

CLINICAL DATA: Ataxia, concern for posterior stroke. Dizziness,
nausea, and vomiting. Intermittent headache.

EXAM:
MRI HEAD WITHOUT CONTRAST
TECHNIQUE: Multiplanar, multiecho pulse sequences of the brain and surrounding
structures were obtained without intravenous contrast.

[Series 5: ax dwi_tracew · axial · 3.0mm · 1.31mm/px · z∈[-92,+69]mm · 6 of 50 slices shown]
[im 1/50]
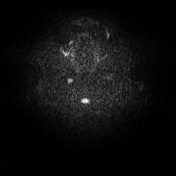
[im 10/50]
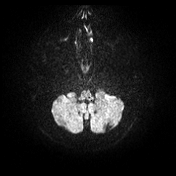
[im 20/50]
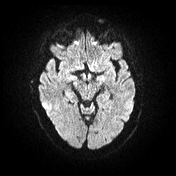
[im 30/50]
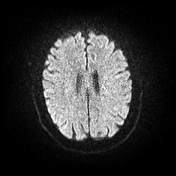
[im 40/50]
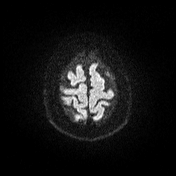
[im 50/50]
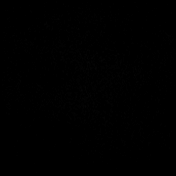

[Series 6: ax dwi_adc · axial · 3.0mm · 1.31mm/px · z∈[-92,+63]mm · 6 of 48 slices shown]
[im 1/48]
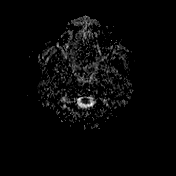
[im 10/48]
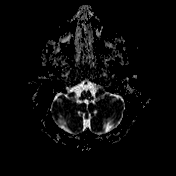
[im 19/48]
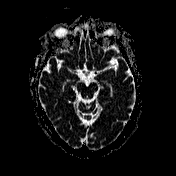
[im 29/48]
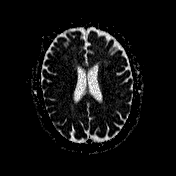
[im 38/48]
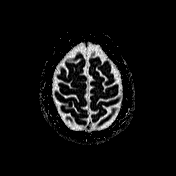
[im 48/48]
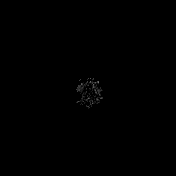

[Series 7: cor dwi_tracew · coronal · 5.0mm · 1.31mm/px · 5 of 38 slices shown]
[im 1/38]
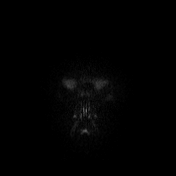
[im 10/38]
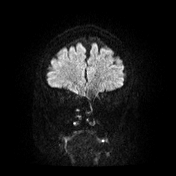
[im 19/38]
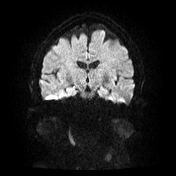
[im 28/38]
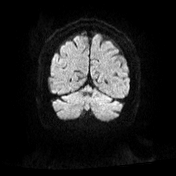
[im 38/38]
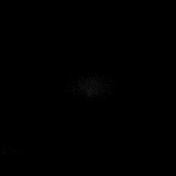

[Series 8: cor dwi_adc · coronal · 5.0mm · 1.31mm/px · 5 of 38 slices shown]
[im 1/38]
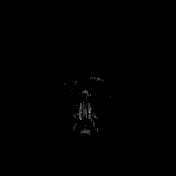
[im 10/38]
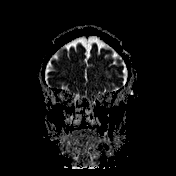
[im 19/38]
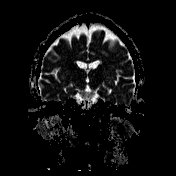
[im 28/38]
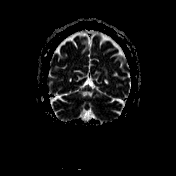
[im 38/38]
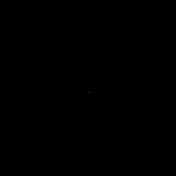

[Series 9: T1 · sagittal · 5.0mm · 0.94mm/px · 3 of 23 slices shown (1 of 2)]
[im 1/23]
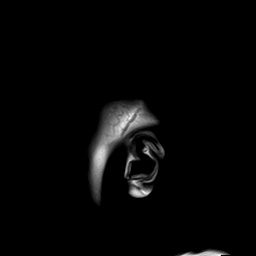
[im 12/23]
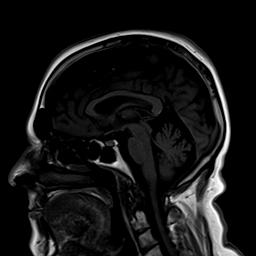
[im 23/23]
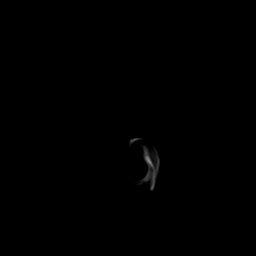

[Series 10: T2 · axial · 3.0mm · 0.45mm/px · z∈[-92,+69]mm · 7 of 50 slices shown (1 of 2)]
[im 1/50]
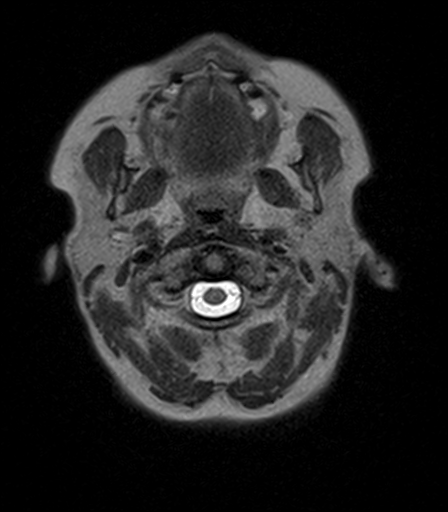
[im 9/50]
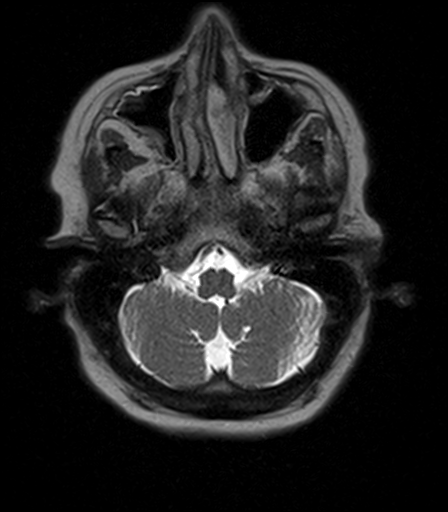
[im 17/50]
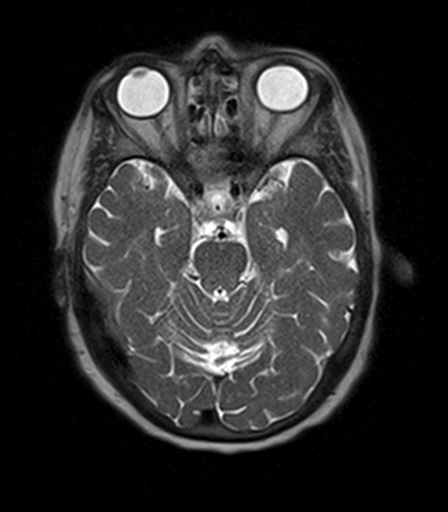
[im 25/50]
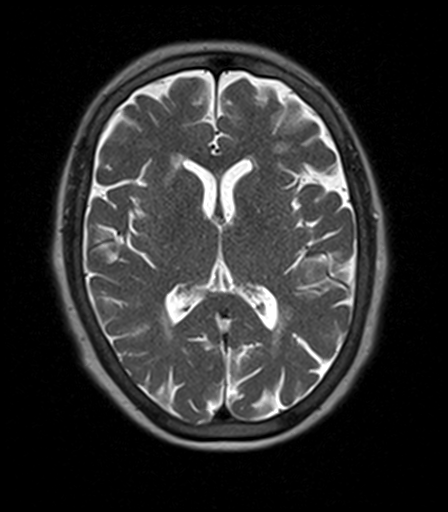
[im 33/50]
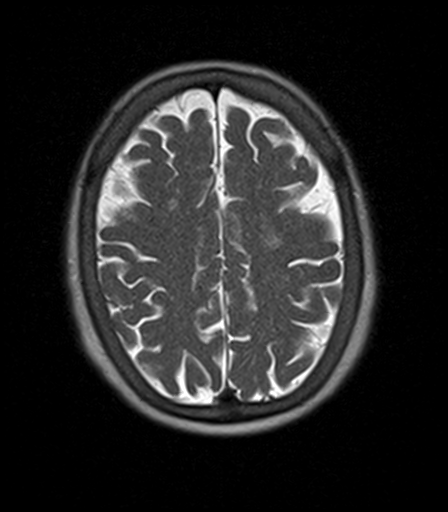
[im 41/50]
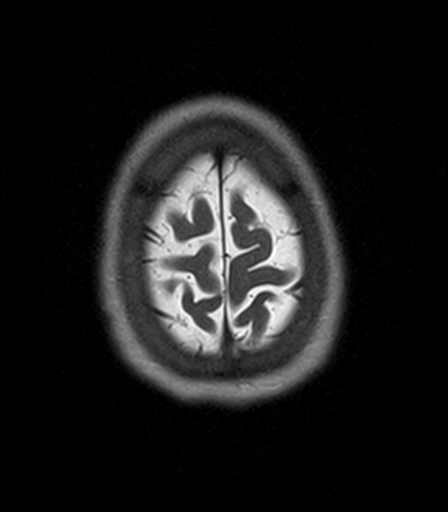
[im 50/50]
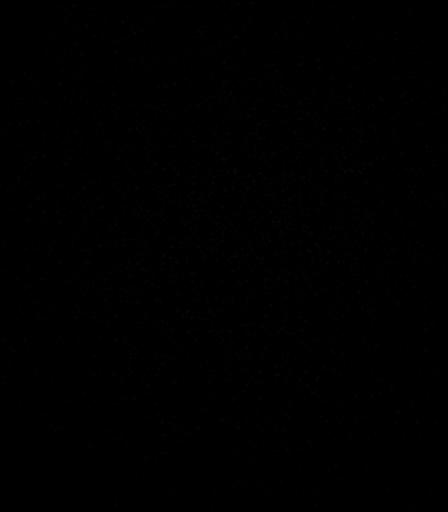

[Series 11: T2-star · axial · 5.0mm · 0.45mm/px · z∈[-89,+67]mm · 4 of 27 slices shown]
[im 1/27]
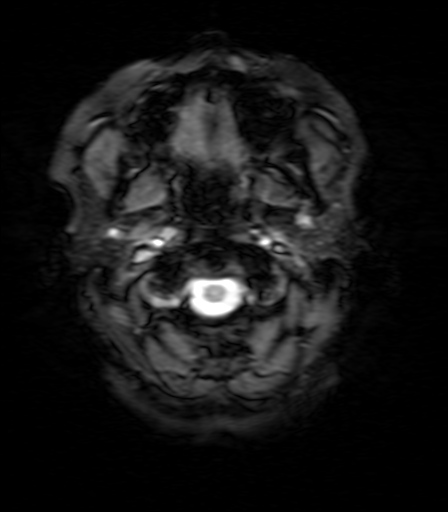
[im 9/27]
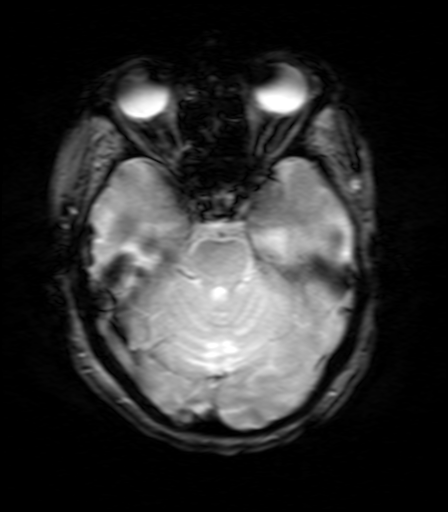
[im 18/27]
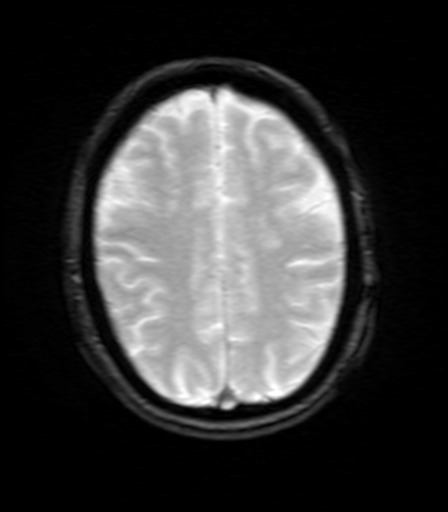
[im 27/27]
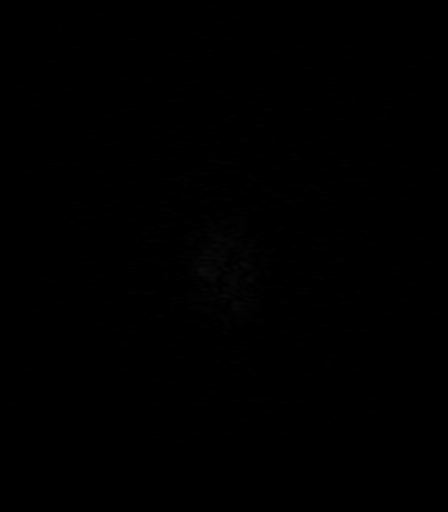

[Series 12: FLAIR · axial · 5.0mm · 1.20mm/px · z∈[-89,+67]mm · 4 of 27 slices shown]
[im 1/27]
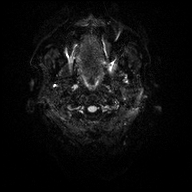
[im 9/27]
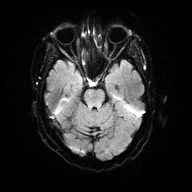
[im 18/27]
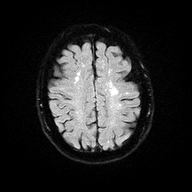
[im 27/27]
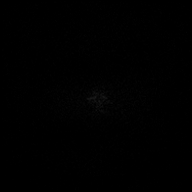

[Series 13: T1 · axial · 5.0mm · 0.90mm/px · z∈[-89,+67]mm · 4 of 27 slices shown (2 of 2)]
[im 1/27]
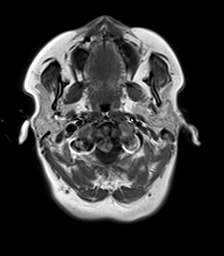
[im 9/27]
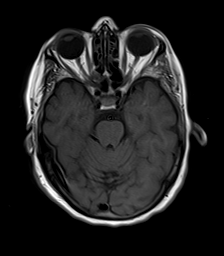
[im 18/27]
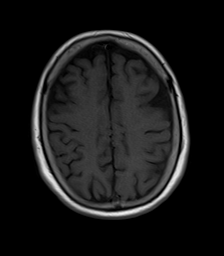
[im 27/27]
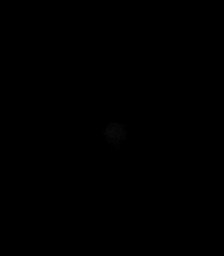

[Series 14: T2 · coronal · 5.0mm · 0.45mm/px · 4 of 31 slices shown (2 of 2)]
[im 1/31]
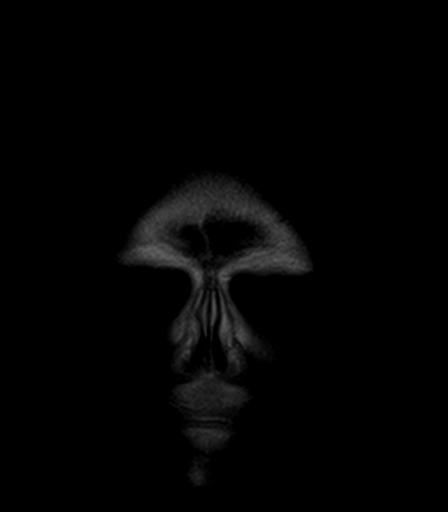
[im 11/31]
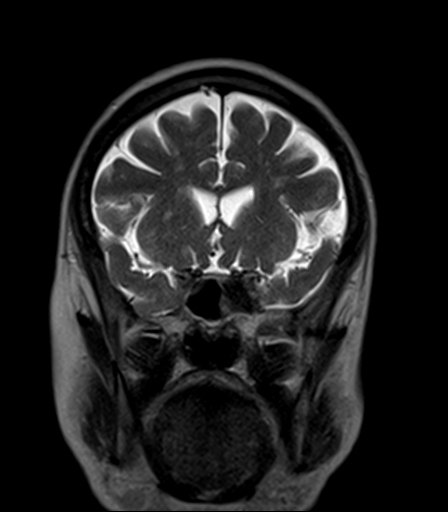
[im 21/31]
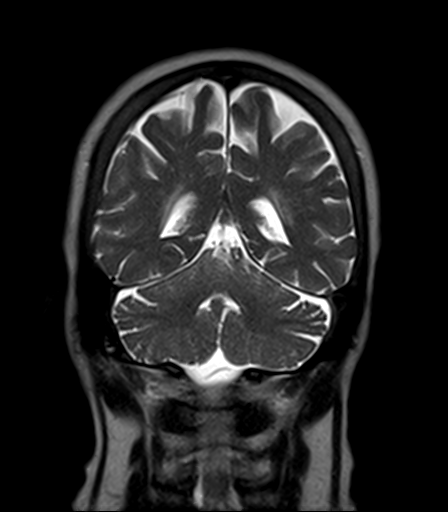
[im 31/31]
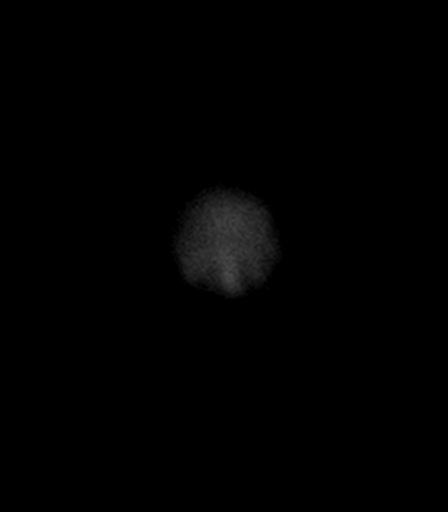

[48 of 48 positions shown; findings below may reference images not displayed]

FINDINGS: Brain: There is no evidence of acute infarct, intracranial
hemorrhage, mass, midline shift, or extra-axial fluid collection.
The ventricles and sulci are within normal limits for age. Patchy T2
hyperintensities in the cerebral white matter bilaterally are
nonspecific but compatible with moderately age advanced chronic
small vessel ischemic disease. Mild chronic small vessel changes are
present in the pons. No focal cerebellar insult is identified.

Vascular: Major intracranial vascular flow voids are preserved.

Skull and upper cervical spine: Unremarkable bone marrow signal.

Sinuses/Orbits: Unremarkable orbits. Mild mucosal thickening in the
paranasal sinuses. Clear mastoid air cells.

Other: None.
IMPRESSION: 1. No acute intracranial abnormality.
2. Moderate chronic small vessel ischemic disease.

## 2021-10-28 ENCOUNTER — Other Ambulatory Visit: Payer: Self-pay | Admitting: Family Medicine

## 2021-10-31 ENCOUNTER — Other Ambulatory Visit: Payer: Self-pay | Admitting: Family Medicine

## 2021-10-31 DIAGNOSIS — E559 Vitamin D deficiency, unspecified: Secondary | ICD-10-CM

## 2021-10-31 DIAGNOSIS — Z780 Asymptomatic menopausal state: Secondary | ICD-10-CM

## 2022-02-04 IMAGING — CR DG RIBS 2V*R*
1 series · 2 of 2 positions shown · non-contrast
Comparison: None.

CLINICAL DATA: Right upper quadrant abdominal pain beneath right
breast since October 2019

EXAM:
RIGHT RIBS - 2 VIEW

[Series 1: view not recorded · 0.14mm/px · 2 of 2 slices shown]
[im 1/2]
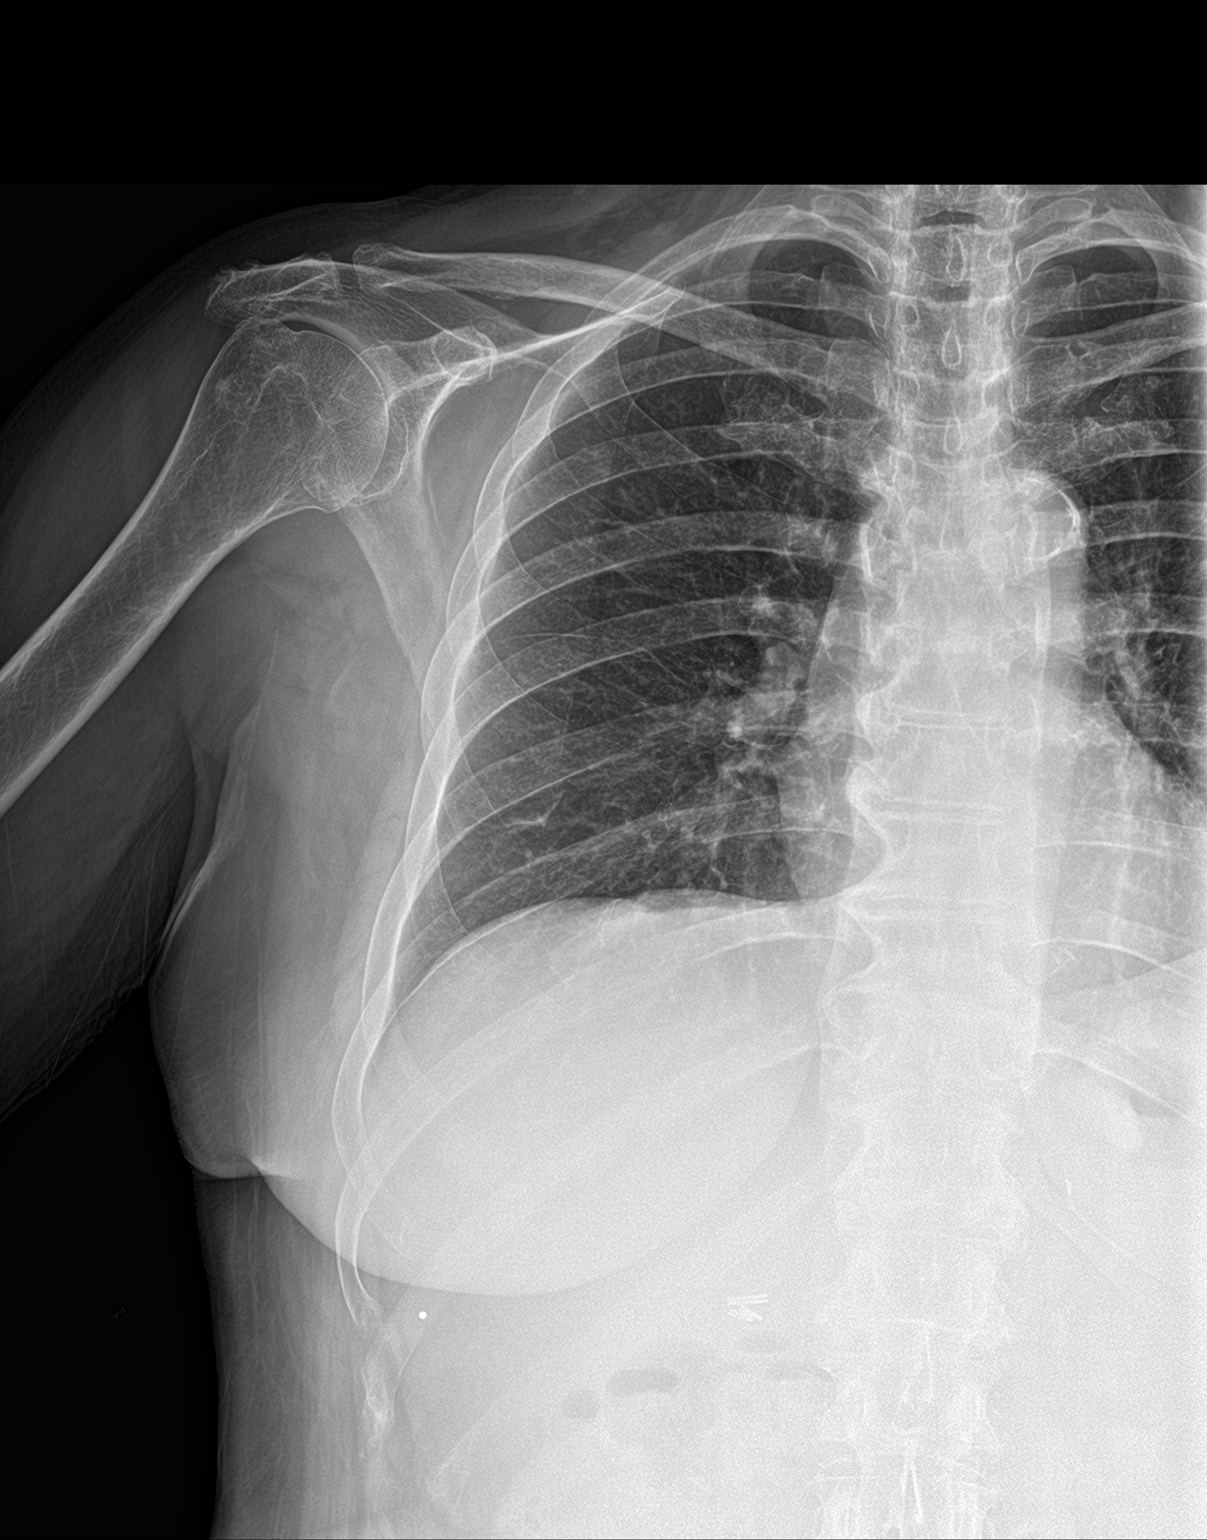
[im 2/2]
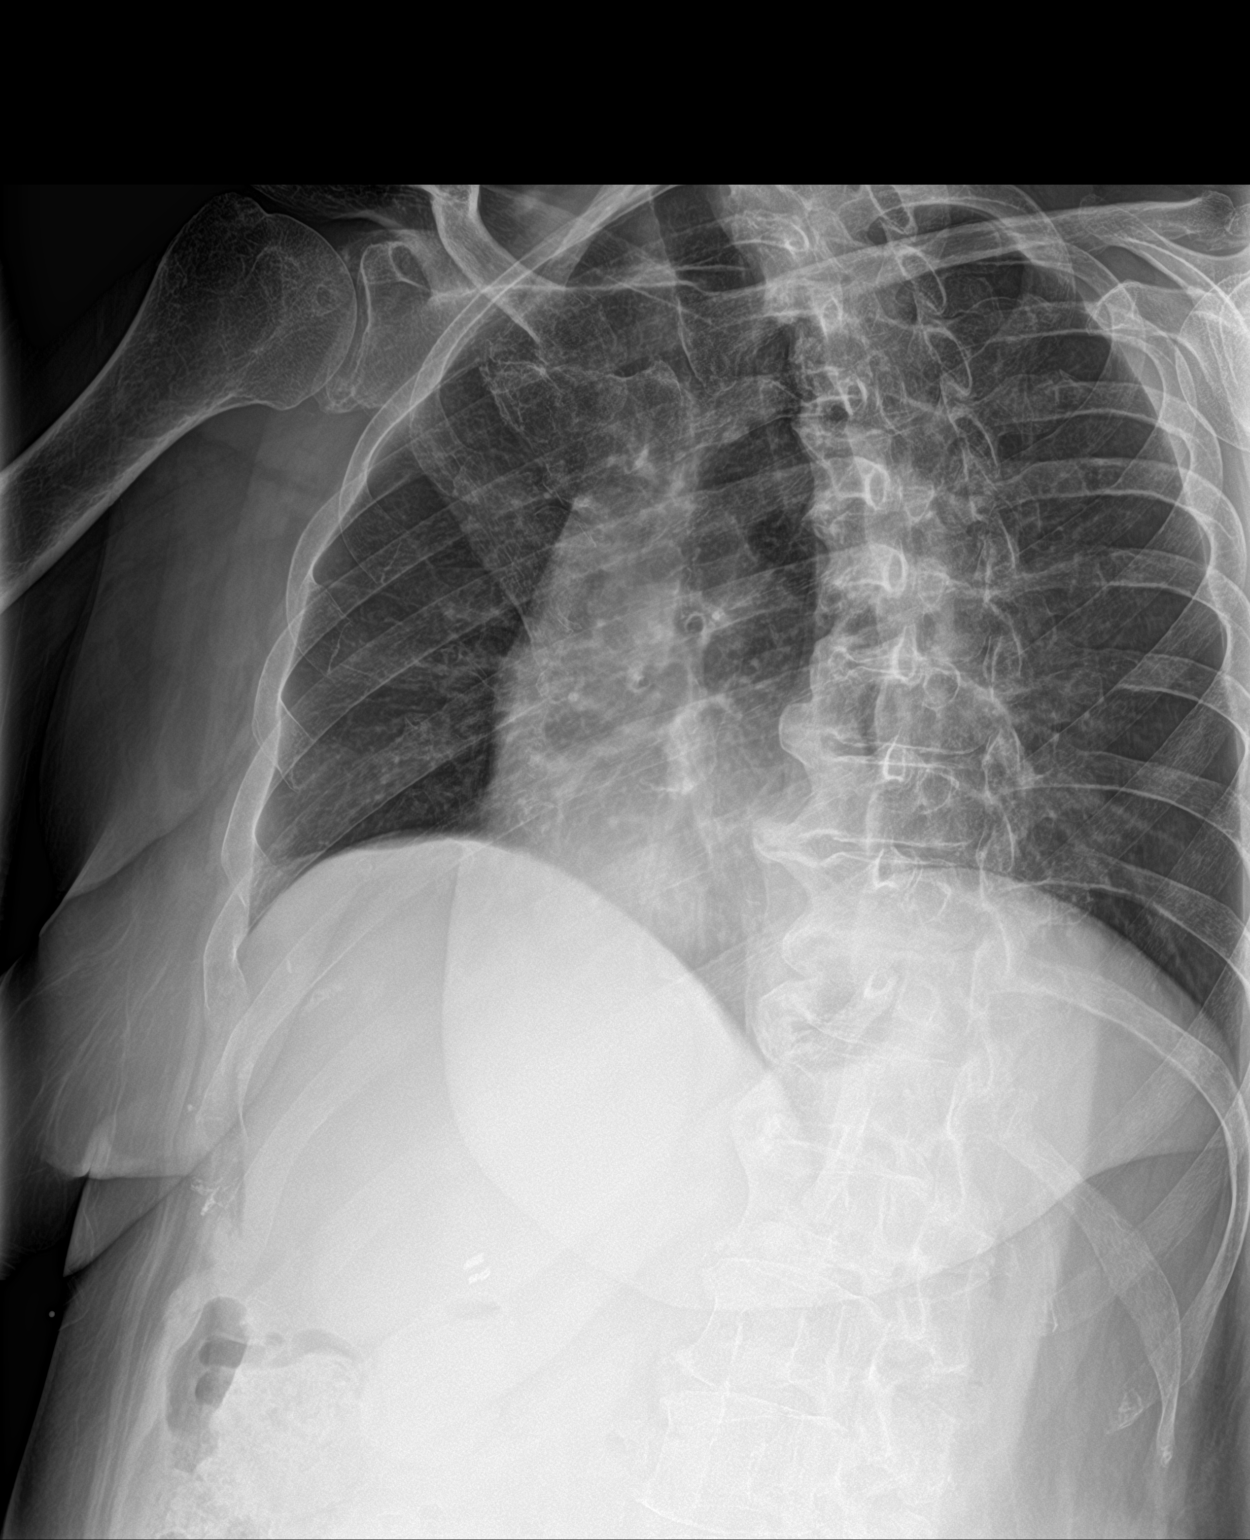

[2 of 2 positions shown; findings below may reference images not displayed]

FINDINGS: No visible displaced rib fracture or other acute or suspicious
osseous abnormality. Degenerative changes are present in the imaged
spine and shoulders. The aorta is calcified. The remaining
cardiomediastinal contours are unremarkable. Included portions of
the lungs are clear. No pneumothorax or effusion. Cholecystectomy
clips in the right upper quadrant.
IMPRESSION: No acute visible displaced rib fracture or other cardiopulmonary
abnormality.

## 2022-11-23 ENCOUNTER — Other Ambulatory Visit: Payer: Self-pay | Admitting: Family Medicine

## 2022-11-23 DIAGNOSIS — Z1231 Encounter for screening mammogram for malignant neoplasm of breast: Secondary | ICD-10-CM

## 2022-11-24 ENCOUNTER — Other Ambulatory Visit: Payer: Self-pay | Admitting: Family Medicine

## 2022-11-24 DIAGNOSIS — Z780 Asymptomatic menopausal state: Secondary | ICD-10-CM

## 2023-01-22 ENCOUNTER — Ambulatory Visit
Admission: RE | Admit: 2023-01-22 | Discharge: 2023-01-22 | Disposition: A | Discharge status: Home / Self Care | Payer: 59 | Source: Ambulatory Visit | Attending: Family Medicine | Admitting: Family Medicine

## 2023-01-22 DIAGNOSIS — Z780 Asymptomatic menopausal state: Secondary | ICD-10-CM | POA: Insufficient documentation

## 2023-01-23 ENCOUNTER — Encounter (INDEPENDENT_AMBULATORY_CARE_PROVIDER_SITE_OTHER): Payer: Self-pay

## 2023-01-25 ENCOUNTER — Other Ambulatory Visit: Payer: Self-pay | Admitting: Family Medicine

## 2023-01-25 DIAGNOSIS — N644 Mastodynia: Secondary | ICD-10-CM

## 2023-02-02 ENCOUNTER — Ambulatory Visit
Admission: RE | Admit: 2023-02-02 | Discharge: 2023-02-02 | Disposition: A | Discharge status: Home / Self Care | Payer: 59 | Source: Ambulatory Visit | Attending: Family Medicine | Admitting: Family Medicine

## 2023-02-02 DIAGNOSIS — N644 Mastodynia: Secondary | ICD-10-CM | POA: Insufficient documentation
